# Patient Record
Sex: Female | Born: 1955 | Race: White | Hispanic: No | Marital: Married | Smoking: Never smoker
Health system: Southern US, Community
[De-identification: ages and names within clinical notes are randomized; demographics above are authoritative.]

## PROBLEM LIST (undated history)

## (undated) DIAGNOSIS — N6001 Solitary cyst of right breast: Secondary | ICD-10-CM

## (undated) DIAGNOSIS — E079 Disorder of thyroid, unspecified: Secondary | ICD-10-CM

## (undated) DIAGNOSIS — E785 Hyperlipidemia, unspecified: Secondary | ICD-10-CM

## (undated) DIAGNOSIS — G35 Multiple sclerosis: Secondary | ICD-10-CM

## (undated) DIAGNOSIS — Z79890 Hormone replacement therapy (postmenopausal): Secondary | ICD-10-CM

## (undated) DIAGNOSIS — N6310 Unspecified lump in the right breast, unspecified quadrant: Secondary | ICD-10-CM

## (undated) DIAGNOSIS — E7800 Pure hypercholesterolemia, unspecified: Secondary | ICD-10-CM

## (undated) DIAGNOSIS — G43909 Migraine, unspecified, not intractable, without status migrainosus: Secondary | ICD-10-CM

## (undated) HISTORY — PX: BREAST SURGERY: SHX581

## (undated) HISTORY — DX: Unspecified lump in the right breast, unspecified quadrant: N63.10

## (undated) HISTORY — DX: Hormone replacement therapy: Z79.890

## (undated) HISTORY — DX: Multiple sclerosis: G35

## (undated) HISTORY — DX: Migraine, unspecified, not intractable, without status migrainosus: G43.909

## (undated) HISTORY — DX: Solitary cyst of right breast: N60.01

## (undated) HISTORY — DX: Disorder of thyroid, unspecified: E07.9

## (undated) HISTORY — DX: Hyperlipidemia, unspecified: E78.5

## (undated) HISTORY — DX: Pure hypercholesterolemia, unspecified: E78.00

## (undated) HISTORY — PX: DILATION AND CURETTAGE OF UTERUS: SHX78

---

## 2000-10-20 ENCOUNTER — Other Ambulatory Visit: Admission: RE | Admit: 2000-10-20 | Discharge: 2000-10-20 | Payer: Self-pay | Admitting: Specialist

## 2003-09-16 ENCOUNTER — Ambulatory Visit: Admission: RE | Admit: 2003-09-16 | Discharge: 2003-09-16 | Payer: Self-pay | Admitting: Otolaryngology

## 2005-12-08 ENCOUNTER — Encounter (INDEPENDENT_AMBULATORY_CARE_PROVIDER_SITE_OTHER): Payer: Self-pay | Admitting: *Deleted

## 2005-12-08 ENCOUNTER — Ambulatory Visit (HOSPITAL_COMMUNITY): Admission: RE | Admit: 2005-12-08 | Discharge: 2005-12-08 | Payer: Self-pay | Admitting: General Surgery

## 2008-01-26 ENCOUNTER — Other Ambulatory Visit: Admission: RE | Admit: 2008-01-26 | Discharge: 2008-01-26 | Payer: Self-pay | Admitting: Obstetrics and Gynecology

## 2008-01-31 ENCOUNTER — Ambulatory Visit (HOSPITAL_COMMUNITY): Admission: RE | Admit: 2008-01-31 | Discharge: 2008-01-31 | Payer: Self-pay | Admitting: Obstetrics & Gynecology

## 2008-06-20 ENCOUNTER — Ambulatory Visit (HOSPITAL_COMMUNITY): Admission: RE | Admit: 2008-06-20 | Discharge: 2008-06-20 | Payer: Self-pay | Admitting: Obstetrics & Gynecology

## 2008-12-11 ENCOUNTER — Ambulatory Visit (HOSPITAL_COMMUNITY): Admission: RE | Admit: 2008-12-11 | Discharge: 2008-12-11 | Payer: Self-pay | Admitting: Family Medicine

## 2009-01-17 ENCOUNTER — Ambulatory Visit (HOSPITAL_COMMUNITY): Admission: RE | Admit: 2009-01-17 | Discharge: 2009-01-17 | Payer: Self-pay | Admitting: General Surgery

## 2009-01-31 ENCOUNTER — Other Ambulatory Visit: Admission: RE | Admit: 2009-01-31 | Discharge: 2009-01-31 | Payer: Self-pay | Admitting: Obstetrics and Gynecology

## 2009-04-10 ENCOUNTER — Encounter: Payer: Self-pay | Admitting: Orthopedic Surgery

## 2009-04-10 ENCOUNTER — Ambulatory Visit (HOSPITAL_COMMUNITY): Admission: RE | Admit: 2009-04-10 | Discharge: 2009-04-10 | Payer: Self-pay | Admitting: Family Medicine

## 2009-04-23 ENCOUNTER — Ambulatory Visit: Payer: Self-pay | Admitting: Orthopedic Surgery

## 2009-04-23 DIAGNOSIS — S93409A Sprain of unspecified ligament of unspecified ankle, initial encounter: Secondary | ICD-10-CM | POA: Insufficient documentation

## 2010-05-28 ENCOUNTER — Other Ambulatory Visit
Admission: RE | Admit: 2010-05-28 | Discharge: 2010-05-28 | Payer: Self-pay | Source: Home / Self Care | Admitting: Obstetrics and Gynecology

## 2010-07-30 ENCOUNTER — Other Ambulatory Visit: Payer: Self-pay | Admitting: Dermatology

## 2010-10-07 NOTE — H&P (Signed)
[name], [name]              ACCOUNT NO.:  [id number]   MEDICAL RECORD NO.:  [id number]         PATIENT TYPE:  PAMB   LOCATION:  DAY                           FACILITY:  [facility]   PHYSICIAN:  Dalia Heading, M.D.  DATE OF BIRTH:  01-14-1956   DATE OF ADMISSION:  01/01/2009  DATE OF DISCHARGE:  LH                              HISTORY & PHYSICAL   CHIEF COMPLAINT:  History of colon polyp.   HISTORY OF PRESENT ILLNESS:  The patient is a 55 year old white female  who presents for followup colonoscopy.  She had a colonoscopy with  polypectomy for a rectal polyp in 2007.  A tubular adenoma with no high-  grade dysplasia or malignancy was found at that time.  She now presents  for followup colonoscopy.  She denies any weight loss, nausea, vomiting,  diarrhea, constipation, melena, or hematochezia.  There is no family  history of colon carcinoma.   PAST MEDICAL HISTORY:  High cholesterol levels.   PAST SURGICAL HISTORY:  Unremarkable.   CURRENT MEDICATIONS:  Lipitor, Prempro, Lexapro, Flonase, Ambien, and  Synthroid.   ALLERGIES:  No known drug allergies.   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  No other cardiopulmonary difficulties noted.   PHYSICAL EXAMINATION:  GENERAL:  The patient is a well-developed, well-  nourished white female in no acute distress.  LUNGS:  Clear to auscultation with equal breath sounds bilaterally.  HEART:  Regular rate and rhythm without S3, S4, murmurs.  ABDOMEN:  Soft, nontender, nondistended.  No hepatosplenomegaly or  masses are noted.  RECTAL:  Deferred to the procedure.   IMPRESSION:  Colon polyps.   PLAN:  The patient is scheduled for a colonoscopy on January 17, 2009.  The risks and benefits of the procedure including bleeding and  perforation were fully explained to the patient, gave informed consent.      Dalia Heading, M.D.  Electronically Signed     MAJ/MEDQ  D:  01/01/2009  T:   01/02/2009  Job:  [id number]   cc:   Corrie Mckusick, M.D.  Fax: [phone]   Short-Stay Unit at [facility]

## 2010-10-10 NOTE — H&P (Signed)
NAMEJOANIE, DUPREY              ACCOUNT NO.:  [id number]   MEDICAL RECORD NO.:  [id number]           PATIENT TYPE:  [facility]   LOCATION:                                FACILITY[facility]   PHYSICIAN:  Dalia Heading, M.D.  DATE OF BIRTH:  May 31, 1955   DATE OF ADMISSION:  DATE OF DISCHARGE:  LH                                HISTORY & PHYSICAL   CHIEF COMPLAINT:  Need for screening colonoscopy.   HISTORY OF PRESENT ILLNESS:  The patient is a 55 year old white female who  is referred for endoscopic evaluation.  She needs a colonoscopy for  screening purposes.  No abdominal pain, weight loss, nausea, vomiting,  diarrhea, constipation, melena, hematochezia has been noted.  She has never  had a colonoscopy.  There is no family history of colon carcinoma.   PAST MEDICAL HISTORY:  Past medical history is unremarkable.   PAST SURGICAL HISTORY:  Unremarkable.   CURRENT MEDICATIONS:  Extrinsic allergy medications, Prempro.   ALLERGIES:  No known drug allergies.   REVIEW OF SYSTEMS:  Noncontributory.   PHYSICAL EXAMINATION:  GENERAL:  The patient is a well-developed, well-  nourished white female in no acute distress.  LUNGS:  Clear to auscultation with equal breath sounds bilaterally.  HEART:  Heart examination reveals regular rate and rhythm without S3, S4,  murmurs.  ABDOMEN:  The abdomen is soft, nontender, nondistended.  No  hepatosplenomegaly or masses are noted.  RECTAL:  Examination was deferred to the procedure.   IMPRESSION:  Need for screening colonoscopy.   PLAN:  The patient is scheduled for colonoscopy on December 08, 2005.  Risks and  benefits of the procedure including bleeding and perforation were fully  explained to the patient, who gave informed consent.      Dalia Heading, M.D.  Electronically Signed     MAJ/MEDQ  D:  12/01/2005  T:  12/01/2005  Job:  [id number]   cc:   Kirk Ruths, M.D.  Fax: [phone]

## 2011-06-17 ENCOUNTER — Other Ambulatory Visit: Payer: Self-pay | Admitting: Adult Health

## 2011-06-17 ENCOUNTER — Other Ambulatory Visit (HOSPITAL_COMMUNITY)
Admission: RE | Admit: 2011-06-17 | Discharge: 2011-06-17 | Disposition: A | Discharge status: Home / Self Care | Payer: 59 | Source: Ambulatory Visit | Attending: Obstetrics and Gynecology | Admitting: Obstetrics and Gynecology

## 2011-06-17 DIAGNOSIS — Z01419 Encounter for gynecological examination (general) (routine) without abnormal findings: Secondary | ICD-10-CM | POA: Insufficient documentation

## 2012-06-27 ENCOUNTER — Other Ambulatory Visit: Payer: Self-pay | Admitting: Adult Health

## 2012-06-27 ENCOUNTER — Other Ambulatory Visit (HOSPITAL_COMMUNITY)
Admission: RE | Admit: 2012-06-27 | Discharge: 2012-06-27 | Disposition: A | Discharge status: Home / Self Care | Payer: 59 | Source: Ambulatory Visit | Attending: Obstetrics and Gynecology | Admitting: Obstetrics and Gynecology

## 2012-06-27 DIAGNOSIS — E049 Nontoxic goiter, unspecified: Secondary | ICD-10-CM

## 2012-06-27 DIAGNOSIS — Z1151 Encounter for screening for human papillomavirus (HPV): Secondary | ICD-10-CM | POA: Insufficient documentation

## 2012-06-27 DIAGNOSIS — Z01419 Encounter for gynecological examination (general) (routine) without abnormal findings: Secondary | ICD-10-CM | POA: Insufficient documentation

## 2012-07-05 ENCOUNTER — Ambulatory Visit (HOSPITAL_COMMUNITY): Payer: 59

## 2012-07-12 ENCOUNTER — Ambulatory Visit (HOSPITAL_COMMUNITY): Payer: 59

## 2012-07-14 ENCOUNTER — Other Ambulatory Visit (HOSPITAL_COMMUNITY): Payer: Self-pay | Admitting: Adult Health

## 2012-07-14 DIAGNOSIS — E049 Nontoxic goiter, unspecified: Secondary | ICD-10-CM

## 2012-07-18 ENCOUNTER — Ambulatory Visit (HOSPITAL_COMMUNITY)
Admission: RE | Admit: 2012-07-18 | Discharge: 2012-07-18 | Disposition: A | Discharge status: Home / Self Care | Payer: 59 | Source: Ambulatory Visit | Attending: Adult Health | Admitting: Adult Health

## 2012-07-18 DIAGNOSIS — E049 Nontoxic goiter, unspecified: Secondary | ICD-10-CM

## 2013-05-30 ENCOUNTER — Encounter: Payer: Self-pay | Admitting: Adult Health

## 2013-06-28 ENCOUNTER — Other Ambulatory Visit: Payer: Self-pay | Admitting: Adult Health

## 2013-07-07 ENCOUNTER — Encounter: Payer: Self-pay | Admitting: Adult Health

## 2013-07-07 ENCOUNTER — Encounter (INDEPENDENT_AMBULATORY_CARE_PROVIDER_SITE_OTHER): Payer: Self-pay

## 2013-07-07 ENCOUNTER — Ambulatory Visit (INDEPENDENT_AMBULATORY_CARE_PROVIDER_SITE_OTHER): Payer: 59 | Admitting: Adult Health

## 2013-07-07 VITALS — BP 140/80 | HR 70 | Ht 63.0 in | Wt 153.0 lb

## 2013-07-07 DIAGNOSIS — G35D Multiple sclerosis, unspecified: Secondary | ICD-10-CM | POA: Insufficient documentation

## 2013-07-07 DIAGNOSIS — Z1212 Encounter for screening for malignant neoplasm of rectum: Secondary | ICD-10-CM

## 2013-07-07 DIAGNOSIS — G35 Multiple sclerosis: Secondary | ICD-10-CM | POA: Insufficient documentation

## 2013-07-07 DIAGNOSIS — Z01419 Encounter for gynecological examination (general) (routine) without abnormal findings: Secondary | ICD-10-CM

## 2013-07-07 LAB — HEMOCCULT GUIAC POC 1CARD (OFFICE): FECAL OCCULT BLD: NEGATIVE

## 2013-07-07 NOTE — Patient Instructions (Signed)
Physical in 1 year Mammogram yearly Colonoscopy as per GI  Labs with PCP

## 2013-07-07 NOTE — Progress Notes (Signed)
Patient ID: [name], female   DOB: Oct 13, 1955, 58 y.o.   MRN: [id number] History of Present Illness: [name] is a 58 year old white female, married in for physical.Had normal pap with negative HPV 06/27/12.Has no complaints, has seen Dr Darlyn Chamber in [place] and had labs recently.   Current Medications, Allergies, Past Medical History, Past Surgical History, Family History and Social History were reviewed in [employer] record.     Review of Systems: Patient denies any headaches, blurred vision, shortness of breath, chest pain, abdominal pain, problems with bowel movements, urination, or intercourse.  No joint pain or mood swings.   Physical Exam:BP 140/80  Pulse 70  Ht 5\' 3"  (1.6 m)  Wt 153 lb (69.4 kg)  BMI 27.11 kg/m2 General:  Well developed, well nourished, no acute distress Skin:  Warm and dry Neck:  Midline trachea, normal thyroid Lungs; Clear to auscultation bilaterally Breast:  No dominant palpable mass, retraction, or nipple discharge, hs bilateral implants. Cardiovascular: Regular rate and rhythm Abdomen:  Soft, non tender, no hepatosplenomegaly Pelvic:  External genitalia is normal in appearance.  The vagina is normal in appearance for age.               The cervix is atrophic and smooth..  Uterus is felt to be normal size, shape, and contour.  No                adnexal masses or tenderness noted. Rectal: Good sphincter tone, no polyps, or hemorrhoids felt.  Hemoccult negative. Extremities:  No swelling or varicosities noted Psych:  No mood changes, alert and cooperative,seems happy, going to have a second grandchild in August.   Impression: Yearly gyn exam no pap MS    Plan: Physical in 1 year Mammogram yearly Colonoscopy as per GI Labs with PCP

## 2014-03-26 ENCOUNTER — Encounter: Payer: Self-pay | Admitting: Adult Health

## 2014-05-16 ENCOUNTER — Other Ambulatory Visit (HOSPITAL_COMMUNITY): Payer: Self-pay | Admitting: Family Medicine

## 2014-05-16 ENCOUNTER — Ambulatory Visit (HOSPITAL_COMMUNITY)
Admission: RE | Admit: 2014-05-16 | Discharge: 2014-05-16 | Disposition: A | Discharge status: Home / Self Care | Payer: 59 | Source: Ambulatory Visit | Attending: Family Medicine | Admitting: Family Medicine

## 2014-05-16 DIAGNOSIS — M545 Low back pain: Secondary | ICD-10-CM | POA: Diagnosis not present

## 2014-05-16 DIAGNOSIS — M4856XA Collapsed vertebra, not elsewhere classified, lumbar region, initial encounter for fracture: Secondary | ICD-10-CM | POA: Diagnosis not present

## 2014-05-16 DIAGNOSIS — S339XXD Sprain of unspecified parts of lumbar spine and pelvis, subsequent encounter: Secondary | ICD-10-CM | POA: Insufficient documentation

## 2014-05-16 DIAGNOSIS — W19XXXD Unspecified fall, subsequent encounter: Secondary | ICD-10-CM | POA: Diagnosis not present

## 2014-05-16 DIAGNOSIS — S335XXD Sprain of ligaments of lumbar spine, subsequent encounter: Secondary | ICD-10-CM

## 2014-07-09 ENCOUNTER — Other Ambulatory Visit: Payer: 59 | Admitting: Adult Health

## 2014-07-10 ENCOUNTER — Encounter: Payer: Self-pay | Admitting: Adult Health

## 2014-08-02 ENCOUNTER — Ambulatory Visit (INDEPENDENT_AMBULATORY_CARE_PROVIDER_SITE_OTHER): Payer: 59 | Admitting: Adult Health

## 2014-08-02 ENCOUNTER — Encounter: Payer: Self-pay | Admitting: Adult Health

## 2014-08-02 VITALS — BP 134/78 | HR 68 | Ht 62.5 in | Wt 124.0 lb

## 2014-08-02 DIAGNOSIS — Z1212 Encounter for screening for malignant neoplasm of rectum: Secondary | ICD-10-CM | POA: Diagnosis not present

## 2014-08-02 DIAGNOSIS — Z01419 Encounter for gynecological examination (general) (routine) without abnormal findings: Secondary | ICD-10-CM

## 2014-08-02 DIAGNOSIS — Z79890 Hormone replacement therapy (postmenopausal): Secondary | ICD-10-CM

## 2014-08-02 HISTORY — DX: Hormone replacement therapy: Z79.890

## 2014-08-02 LAB — HEMOCCULT GUIAC POC 1CARD (OFFICE): FECAL OCCULT BLD: NEGATIVE

## 2014-08-02 NOTE — Patient Instructions (Signed)
Pap and physical in 1 year Mammogram yearly Labs with PCP 

## 2014-08-02 NOTE — Progress Notes (Signed)
Patient ID: [name], female   DOB: Mar 02, 1956, 59 y.o.   MRN: [id number] History of Present Illness: [name] is a 59 year old white female, married in for well woman gyn exam.She had normal pap with negative HPV 06/27/12.   Current Medications, Allergies, Past Medical History, Past Surgical History, Family History and Social History were reviewed in [employer] record.     Review of Systems: Patient denies any headaches, hearing loss, fatigue, blurred vision, shortness of breath, chest pain, abdominal pain, problems with bowel movements, urination, or intercourse. No joint pain or mood swings.Has no complaints and loves her HRT.She had labs with PCP(Dr Hilma Favors)    Physical Exam:BP 134/78 mmHg  Pulse 68  Ht 5' 2.5" (1.588 m)  Wt 124 lb (56.246 kg)  BMI 22.30 kg/m2 General:  Well developed, well nourished, no acute distress Skin:  Warm and dry Neck:  Midline trachea, normal thyroid, good ROM, no lymphadenopathy, no carotid bruits Lungs; Clear to auscultation bilaterally Breast:  No dominant palpable mass, retraction, or nipple discharge, bilateral implants Cardiovascular: Regular rate and rhythm Abdomen:  Soft, non tender, no hepatosplenomegaly Pelvic:  External genitalia is normal in appearance, no lesions.  The vagina has decreased color, moisture and rugae. Urethra has no lesions or masses. The cervix is smooth.  Uterus is felt to be normal size, shape, and contour.  No adnexal masses or tenderness noted.Bladder is non tender, no masses felt. Rectal: Good sphincter tone, no polyps, or hemorrhoids felt.  Hemoccult negative. Extremities/musculoskeletal:  No swelling or varicosities noted, no clubbing or cyanosis Psych:  No mood changes, alert and cooperative,seems happy   Impression: Well woman gyn exam no pap HRT MS    Plan: Call if needs refills on estrace and promethium Pap and physical in 1 year Mammogram yearly  Labs with PCP Colonoscopy per  GI

## 2014-12-17 ENCOUNTER — Other Ambulatory Visit: Payer: Self-pay | Admitting: Adult Health

## 2014-12-17 MED ORDER — ESTRADIOL 0.5 MG PO TABS: 0.5000 mg | ORAL_TABLET | Freq: Every day | ORAL | Status: DC

## 2014-12-18 ENCOUNTER — Other Ambulatory Visit (HOSPITAL_COMMUNITY): Payer: Self-pay | Admitting: Family Medicine

## 2014-12-18 ENCOUNTER — Other Ambulatory Visit: Payer: Self-pay | Admitting: Adult Health

## 2014-12-18 DIAGNOSIS — E039 Hypothyroidism, unspecified: Secondary | ICD-10-CM

## 2014-12-18 DIAGNOSIS — R599 Enlarged lymph nodes, unspecified: Secondary | ICD-10-CM

## 2014-12-18 MED ORDER — PROGESTERONE MICRONIZED 100 MG PO CAPS: 100.0000 mg | ORAL_CAPSULE | Freq: Every day | ORAL | Status: DC

## 2014-12-18 MED ORDER — TESTOSTERONE 10 MG/ACT (2%) TD GEL: TRANSDERMAL | Status: DC

## 2014-12-20 ENCOUNTER — Ambulatory Visit (HOSPITAL_COMMUNITY): Payer: 59

## 2015-01-10 ENCOUNTER — Ambulatory Visit (HOSPITAL_COMMUNITY): Payer: 59

## 2015-01-24 ENCOUNTER — Other Ambulatory Visit: Payer: Self-pay | Admitting: *Deleted

## 2015-06-29 ENCOUNTER — Other Ambulatory Visit: Payer: Self-pay | Admitting: Adult Health

## 2015-07-24 ENCOUNTER — Encounter: Payer: Self-pay | Admitting: Adult Health

## 2015-07-24 DIAGNOSIS — N6001 Solitary cyst of right breast: Secondary | ICD-10-CM | POA: Insufficient documentation

## 2015-07-24 HISTORY — DX: Solitary cyst of right breast: N60.01

## 2015-07-29 ENCOUNTER — Encounter: Payer: Self-pay | Admitting: Adult Health

## 2015-08-06 ENCOUNTER — Other Ambulatory Visit (HOSPITAL_COMMUNITY)
Admission: RE | Admit: 2015-08-06 | Discharge: 2015-08-06 | Disposition: A | Discharge status: Home / Self Care | Payer: 59 | Source: Ambulatory Visit | Attending: Adult Health | Admitting: Adult Health

## 2015-08-06 ENCOUNTER — Encounter: Payer: Self-pay | Admitting: Adult Health

## 2015-08-06 ENCOUNTER — Ambulatory Visit (INDEPENDENT_AMBULATORY_CARE_PROVIDER_SITE_OTHER): Payer: 59 | Admitting: Adult Health

## 2015-08-06 VITALS — BP 130/70 | HR 72 | Ht 63.0 in | Wt 128.0 lb

## 2015-08-06 DIAGNOSIS — Z01419 Encounter for gynecological examination (general) (routine) without abnormal findings: Secondary | ICD-10-CM

## 2015-08-06 DIAGNOSIS — Z1151 Encounter for screening for human papillomavirus (HPV): Secondary | ICD-10-CM | POA: Insufficient documentation

## 2015-08-06 DIAGNOSIS — Z79890 Hormone replacement therapy (postmenopausal): Secondary | ICD-10-CM

## 2015-08-06 DIAGNOSIS — Z1212 Encounter for screening for malignant neoplasm of rectum: Secondary | ICD-10-CM | POA: Diagnosis not present

## 2015-08-06 LAB — HEMOCCULT GUIAC POC 1CARD (OFFICE): Fecal Occult Blood, POC: NEGATIVE

## 2015-08-06 NOTE — Progress Notes (Signed)
Patient ID: [name], female   DOB: [birth year], 60 y.o.   MRN: [id number] History of Present Illness: [name] is a 60 year old white female, married, in for a well woman gyn exam and pap. 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 PCP is Dr Hilma Favors.   Current Medications, Allergies, Past Medical History, Past Surgical History, Family History and Social History were reviewed in [employer] record.     Review of Systems: Patient denies any daily headaches, hearing loss, fatigue, blurred vision, shortness of breath, chest pain, abdominal pain, problems with bowel movements, urination, or intercourse. No joint pain or mood swings.See HPI for positives.    Physical Exam:BP 130/70 mmHg  Pulse 72  Ht 5\' 3"  (1.6 m)  Wt 128 lb (58.06 kg)  BMI 22.68 kg/m2 General:  Well developed, well nourished, no acute distress Skin:  Warm and dry Neck:  Midline trachea, normal thyroid, good ROM, no lymphadenopathy, no carotid bruits heard Lungs; Clear to auscultation bilaterally Breast:  No dominant palpable mass, retraction, or nipple discharge, has bilateral implants Cardiovascular: Regular rate and rhythm Abdomen:  Soft, non tender, no hepatosplenomegaly Pelvic:  External genitalia is normal in appearance, no lesions.  The vagina has decreased color, moisture and rugae. Urethra has no lesions or masses. The cervix is smooth, pap with HPV performed.  Uterus is felt to be normal size, shape, and contour.  No adnexal masses or tenderness noted.Bladder is non tender, no masses felt. Rectal: Good sphincter tone, no polyps, or hemorrhoids felt.  Hemoccult negative. Extremities/musculoskeletal:  No swelling or varicosities noted, no clubbing or cyanosis Psych:  No mood changes, alert and cooperative,seems  happy   Impression: Well woman gyn exam and pap HRT    Plan: Physical in 1 year, pap in 3 if normal Mammogram in 6 months then yearly if normal Labs with PCP Colonoscopy per Dr Arnoldo Morale  Continue HRT has refills

## 2015-08-06 NOTE — Patient Instructions (Signed)
Physical in 1 year Pap in 3 if normal Labs with PCP Mammogram in 6 months, then yearly Colonoscopy per Dr Arnoldo Morale

## 2015-08-07 LAB — CYTOLOGY - PAP

## 2015-09-16 ENCOUNTER — Other Ambulatory Visit: Payer: Self-pay | Admitting: Adult Health

## 2015-09-30 ENCOUNTER — Telehealth: Payer: Self-pay | Admitting: Adult Health

## 2015-09-30 NOTE — Telephone Encounter (Signed)
She has found new spot on breast, will make appt for Thursday at her request, be here at 2:30 to be examined

## 2015-10-03 ENCOUNTER — Encounter: Payer: Self-pay | Admitting: Adult Health

## 2015-10-03 ENCOUNTER — Ambulatory Visit (INDEPENDENT_AMBULATORY_CARE_PROVIDER_SITE_OTHER): Payer: 59 | Admitting: Adult Health

## 2015-10-03 VITALS — BP 112/62 | HR 84 | Ht 63.0 in | Wt 128.0 lb

## 2015-10-03 DIAGNOSIS — N63 Unspecified lump in breast: Secondary | ICD-10-CM | POA: Diagnosis not present

## 2015-10-03 DIAGNOSIS — N6310 Unspecified lump in the right breast, unspecified quadrant: Secondary | ICD-10-CM

## 2015-10-03 DIAGNOSIS — Z9882 Breast implant status: Secondary | ICD-10-CM | POA: Diagnosis not present

## 2015-10-03 HISTORY — DX: Unspecified lump in the right breast, unspecified quadrant: N63.10

## 2015-10-03 NOTE — Patient Instructions (Addendum)
Get right mammogram and [place] at 5/16 at 7:45 am at [facility] Follow up prn

## 2015-10-03 NOTE — Progress Notes (Signed)
Subjective:     Patient ID: [name], female   DOB: 07-13-1955, 60 y.o.   MRN: [id number]  HPI [name] is a 60 year old white female, married in for breast exam,she has found a lump in right breast, she has implants, and gets her mammograms at [facility].   Review of Systems +breast mass on right Reviewed past medical,surgical, social and family history. Reviewed medications and allergies.     Objective:   Physical Exam BP 112/62 mmHg  Pulse 84  Ht 5\' 3"  (1.6 m)  Wt 128 lb (58.06 kg)  BMI 22.68 kg/m2   Skin warm and dry,  Breasts:no dominate palpable mass, retraction or nipple discharge on left, no retraction or nipple discharge on the right but has BB sized mass at 3 0'clock on edge of areola, non tender and mobile, she has bilateral implants    Assessment:     Right breast mass    Plan:     Right diagnostic mammogram and [place] at [facility] 5/16 at 7:45 am Follow up prn

## 2015-10-18 ENCOUNTER — Encounter: Payer: Self-pay | Admitting: Adult Health

## 2015-11-15 ENCOUNTER — Other Ambulatory Visit: Payer: Self-pay | Admitting: Family Medicine

## 2015-11-15 ENCOUNTER — Other Ambulatory Visit: Payer: Self-pay | Admitting: Adult Health

## 2015-11-15 DIAGNOSIS — R5381 Other malaise: Secondary | ICD-10-CM

## 2015-11-18 ENCOUNTER — Other Ambulatory Visit: Payer: Self-pay | Admitting: Family Medicine

## 2015-11-18 DIAGNOSIS — E2839 Other primary ovarian failure: Secondary | ICD-10-CM

## 2015-12-10 ENCOUNTER — Inpatient Hospital Stay: Admission: RE | Admit: 2015-12-10 | Payer: 59 | Source: Ambulatory Visit

## 2015-12-11 ENCOUNTER — Other Ambulatory Visit: Payer: Self-pay | Admitting: Adult Health

## 2015-12-23 ENCOUNTER — Ambulatory Visit
Admission: RE | Admit: 2015-12-23 | Discharge: 2015-12-23 | Disposition: A | Discharge status: Home / Self Care | Payer: 59 | Source: Ambulatory Visit | Attending: Family Medicine | Admitting: Family Medicine

## 2015-12-23 DIAGNOSIS — E2839 Other primary ovarian failure: Secondary | ICD-10-CM

## 2016-01-07 ENCOUNTER — Encounter: Payer: Self-pay | Admitting: Adult Health

## 2016-03-19 ENCOUNTER — Other Ambulatory Visit: Payer: Self-pay | Admitting: Adult Health

## 2016-07-27 ENCOUNTER — Other Ambulatory Visit: Payer: Self-pay | Admitting: Adult Health

## 2016-08-06 ENCOUNTER — Ambulatory Visit (INDEPENDENT_AMBULATORY_CARE_PROVIDER_SITE_OTHER): Payer: 59 | Admitting: Adult Health

## 2016-08-06 ENCOUNTER — Encounter: Payer: Self-pay | Admitting: Adult Health

## 2016-08-06 VITALS — BP 130/78 | HR 80 | Ht 62.25 in | Wt 138.0 lb

## 2016-08-06 DIAGNOSIS — Z79890 Hormone replacement therapy (postmenopausal): Secondary | ICD-10-CM

## 2016-08-06 DIAGNOSIS — Z1212 Encounter for screening for malignant neoplasm of rectum: Secondary | ICD-10-CM

## 2016-08-06 DIAGNOSIS — Z131 Encounter for screening for diabetes mellitus: Secondary | ICD-10-CM

## 2016-08-06 DIAGNOSIS — R5383 Other fatigue: Secondary | ICD-10-CM

## 2016-08-06 DIAGNOSIS — Z1211 Encounter for screening for malignant neoplasm of colon: Secondary | ICD-10-CM

## 2016-08-06 DIAGNOSIS — Z01419 Encounter for gynecological examination (general) (routine) without abnormal findings: Secondary | ICD-10-CM

## 2016-08-06 DIAGNOSIS — E039 Hypothyroidism, unspecified: Secondary | ICD-10-CM

## 2016-08-06 LAB — HEMOCCULT GUIAC POC 1CARD (OFFICE): FECAL OCCULT BLD: NEGATIVE

## 2016-08-06 NOTE — Progress Notes (Signed)
Patient ID: [name], female   DOB: Jul 23, 1955, 61 y.o.   MRN: [id number] History of Present Illness:  [name] is a 61 year old white female, married in for a well woman gyn exam, she had a normal pap with negative HPV 08/06/15. PCP is Dr Hilma Favors.  Current Medications, Allergies, Past Medical History, Past Surgical History, Family History and Social History were reviewed in [employer] record.     Review of Systems: Patient denies any headaches, hearing loss, blurred vision, shortness of breath, chest pain, abdominal pain, problems with bowel movements, urination, or intercourse. No joint pain or mood swings. +fatigue   Physical Exam: General:  Well developed, well nourished, no acute distress Skin:  Warm and dry Neck:  Midline trachea, normal thyroid, good ROM, no lymphadenopathy Lungs; Clear to auscultation bilaterally Breast:  No dominant palpable mass, retraction, or nipple discharge,has bilateral implants and some bilateral irregularities.  Cardiovascular: Regular rate and rhythm Abdomen:  Soft, non tender, no hepatosplenomegaly Pelvic:  External genitalia is normal in appearance, no lesions.  The vagina has decreased color, moisture and rugae. Urethra has no lesions or masses. The cervix is atrophic.  Uterus is felt to be normal size, shape, and contour.  No adnexal masses or tenderness noted.Bladder is non tender, no masses felt. Rectal: Good sphincter tone, no polyps, or hemorrhoids felt.  Hemoccult negative. Extremities/musculoskeletal:  No swelling or varicosities noted, no clubbing or cyanosis Psych:  No mood changes, alert and cooperative,seems happy PHQ 2 score 0.  Impression:  1. Well woman exam with routine gynecological exam   2. Screening for colorectal cancer   3. Hormone replacement therapy (HRT)   4. Screening for diabetes mellitus   5. Fatigue, unspecified type   6. Hypothyroidism, unspecified type      Plan: Check CBC,CMP,TSH and  lipids.free T4, A1c and vitamin D Physical in 1 year Pap in 2020 Mammogram yearly Colonoscopy per GI F/U with Dr Hilma Favors next week

## 2016-08-07 ENCOUNTER — Telehealth: Payer: Self-pay | Admitting: Adult Health

## 2016-08-07 LAB — CBC
HEMOGLOBIN: 13.4 g/dL (ref 11.1–15.9)
Hematocrit: 40.7 % (ref 34.0–46.6)
MCH: 29.3 pg (ref 26.6–33.0)
MCHC: 32.9 g/dL (ref 31.5–35.7)
MCV: 89 fL (ref 79–97)
PLATELETS: 304 10*3/uL (ref 150–379)
RBC: 4.57 x10E6/uL (ref 3.77–5.28)
RDW: 13.1 % (ref 12.3–15.4)
WBC: 5.6 10*3/uL (ref 3.4–10.8)

## 2016-08-07 LAB — COMPREHENSIVE METABOLIC PANEL
ALK PHOS: 64 IU/L (ref 39–117)
ALT: 17 IU/L (ref 0–32)
AST: 16 IU/L (ref 0–40)
Albumin/Globulin Ratio: 1.7 (ref 1.2–2.2)
Albumin: 4.7 g/dL (ref 3.6–4.8)
BUN/Creatinine Ratio: 18 (ref 12–28)
BUN: 15 mg/dL (ref 8–27)
Bilirubin Total: 0.3 mg/dL (ref 0.0–1.2)
CALCIUM: 9.6 mg/dL (ref 8.7–10.3)
CO2: 25 mmol/L (ref 18–29)
CREATININE: 0.85 mg/dL (ref 0.57–1.00)
Chloride: 98 mmol/L (ref 96–106)
GFR calc Af Amer: 84 mL/min/{1.73_m2} (ref >59)
GFR calc non Af Amer: 73 mL/min/{1.73_m2} (ref >59)
GLOBULIN, TOTAL: 2.8 g/dL (ref 1.5–4.5)
GLUCOSE: 90 mg/dL (ref 65–99)
Potassium: 4.5 mmol/L (ref 3.5–5.2)
SODIUM: 138 mmol/L (ref 134–144)
TOTAL PROTEIN: 7.5 g/dL (ref 6.0–8.5)

## 2016-08-07 LAB — VITAMIN D 25 HYDROXY (VIT D DEFICIENCY, FRACTURES): Vit D, 25-Hydroxy: 34.5 ng/mL (ref 30.0–100.0)

## 2016-08-07 LAB — LIPID PANEL
CHOLESTEROL TOTAL: 283 mg/dL — AB (ref 100–199)
Chol/HDL Ratio: 4.2 ratio units (ref 0.0–4.4)
HDL: 67 mg/dL (ref >39)
LDL Calculated: 180 mg/dL — ABNORMAL HIGH (ref 0–99)
Triglycerides: 180 mg/dL — ABNORMAL HIGH (ref 0–149)
VLDL CHOLESTEROL CAL: 36 mg/dL (ref 5–40)

## 2016-08-07 LAB — HEMOGLOBIN A1C
ESTIMATED AVERAGE GLUCOSE: 105 mg/dL
HEMOGLOBIN A1C: 5.3 % (ref 4.8–5.6)

## 2016-08-07 LAB — T4, FREE: Free T4: 1.55 ng/dL (ref 0.82–1.77)

## 2016-08-07 LAB — TSH: TSH: 3.34 u[IU]/mL (ref 0.450–4.500)

## 2016-08-07 NOTE — Telephone Encounter (Signed)
Pt aware of labs, has appt with Dr Hilma Favors next week, to review then

## 2016-08-11 ENCOUNTER — Telehealth: Payer: Self-pay | Admitting: Adult Health

## 2016-08-11 MED ORDER — NYSTATIN-TRIAMCINOLONE 100000-0.1 UNIT/GM-% EX CREA: 1.0000 "application " | TOPICAL_CREAM | Freq: Two times a day (BID) | CUTANEOUS | 0 refills | Status: DC

## 2016-08-11 NOTE — Telephone Encounter (Signed)
Pt seen you the other week and you guys had talked about a cream and she has decided to get that can you call into [facility]

## 2016-08-11 NOTE — Telephone Encounter (Signed)
Some external vaginal itching at times, will rx mytrex

## 2016-10-28 IMAGING — CR DG LUMBAR SPINE 2-3V
3 series · 3 of 3 positions shown · non-contrast
Comparison: None.

CLINICAL DATA: Fall.

EXAM:
LUMBAR SPINE - 2-3 VIEW

[view not recorded (1 of 3)]
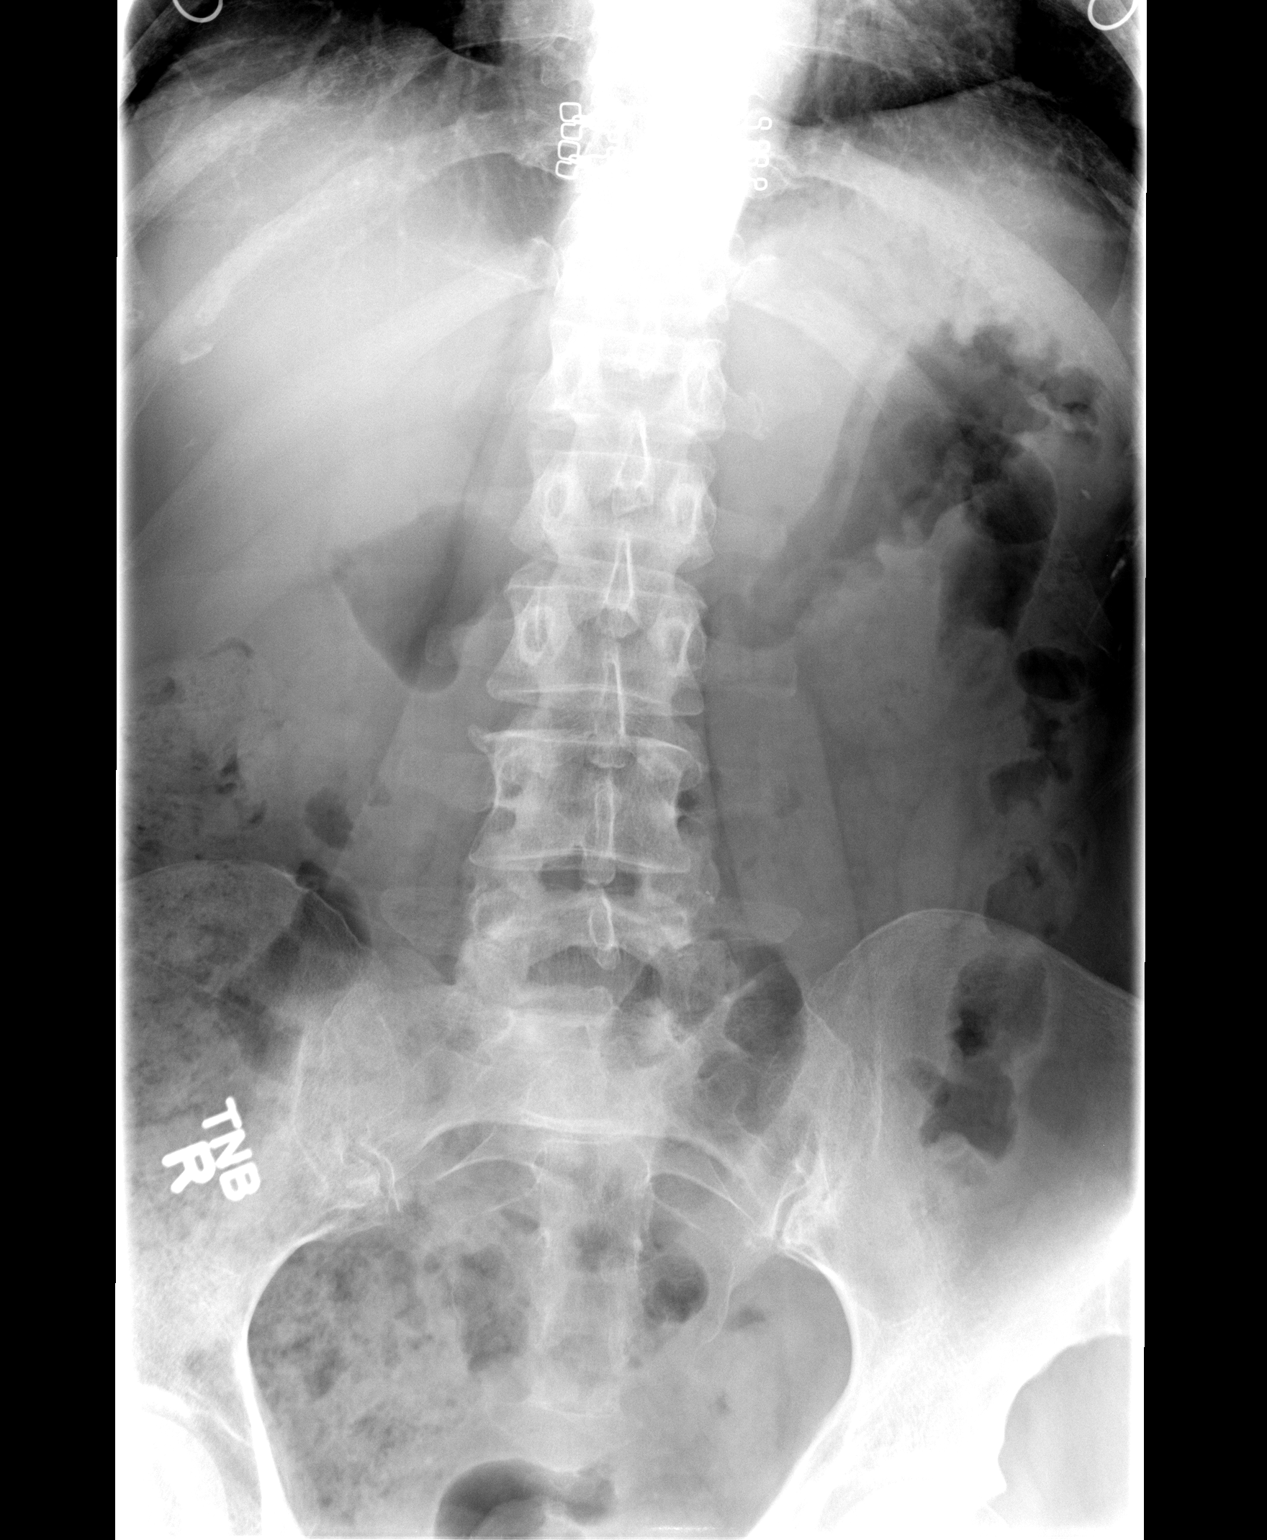

[view not recorded (2 of 3)]
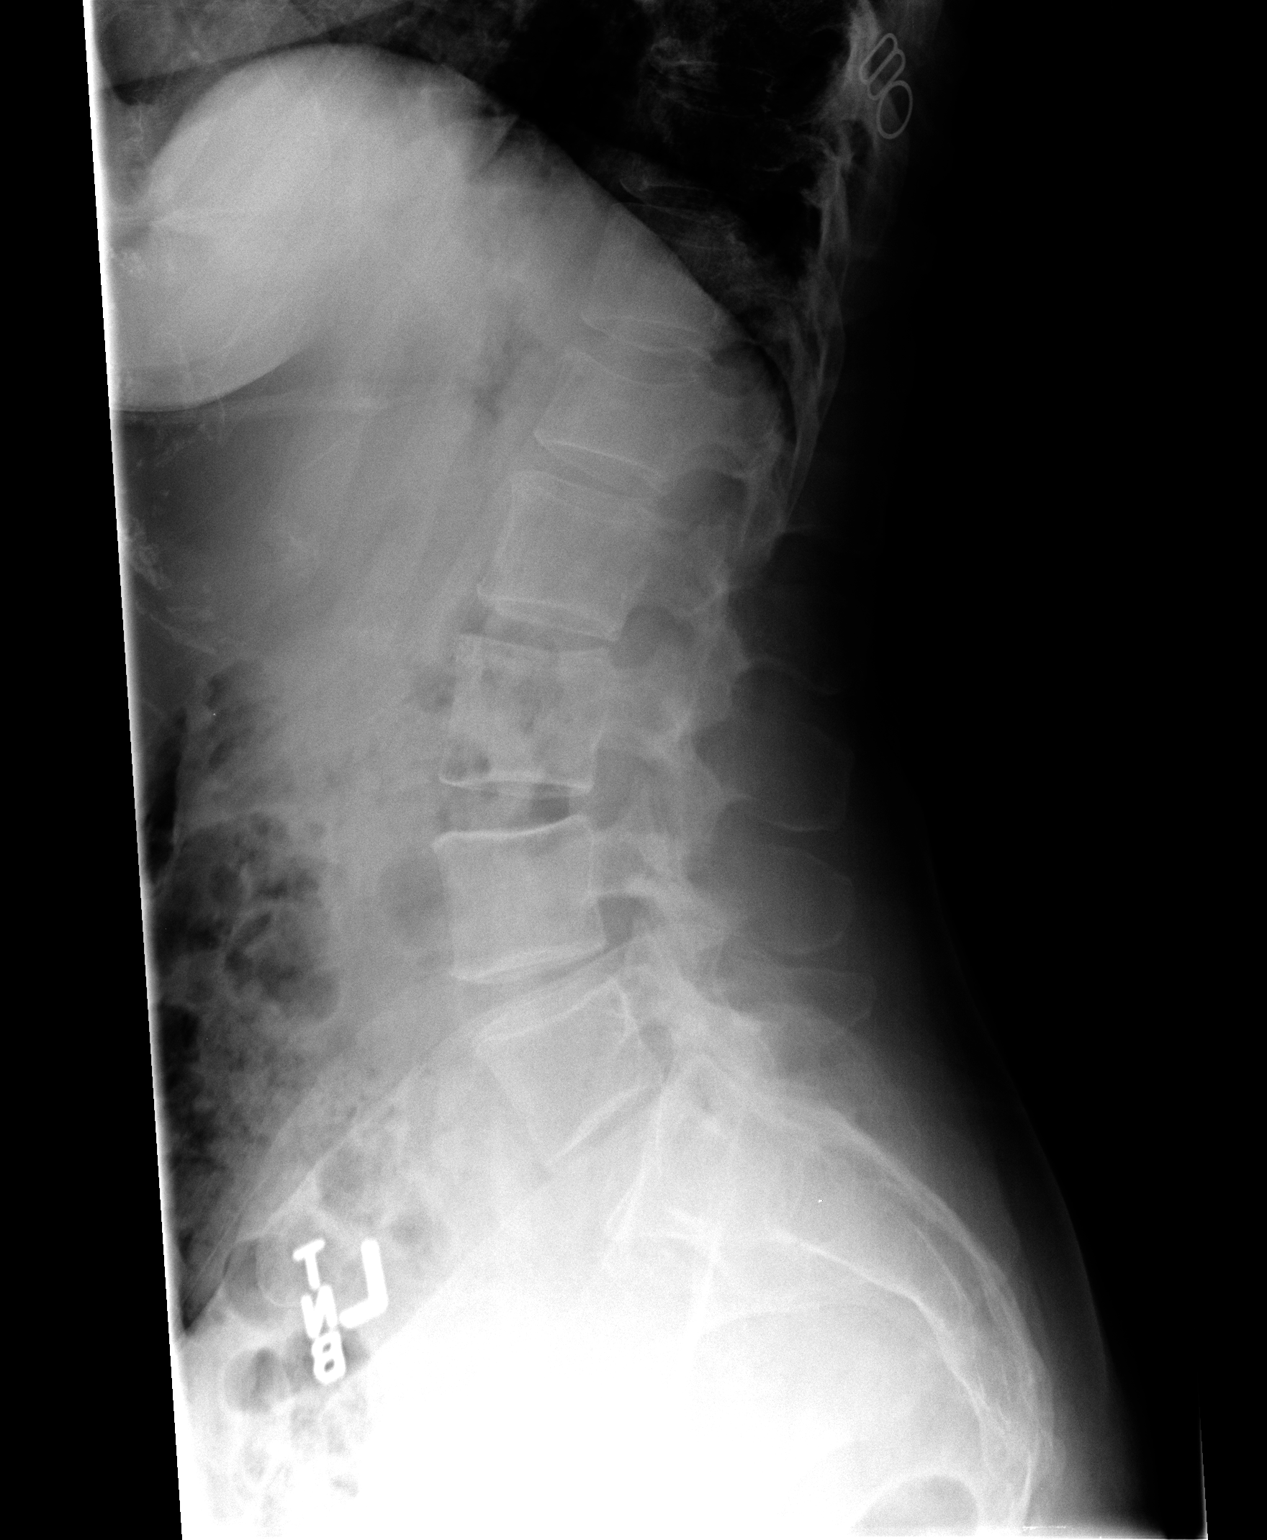

[view not recorded (3 of 3)]
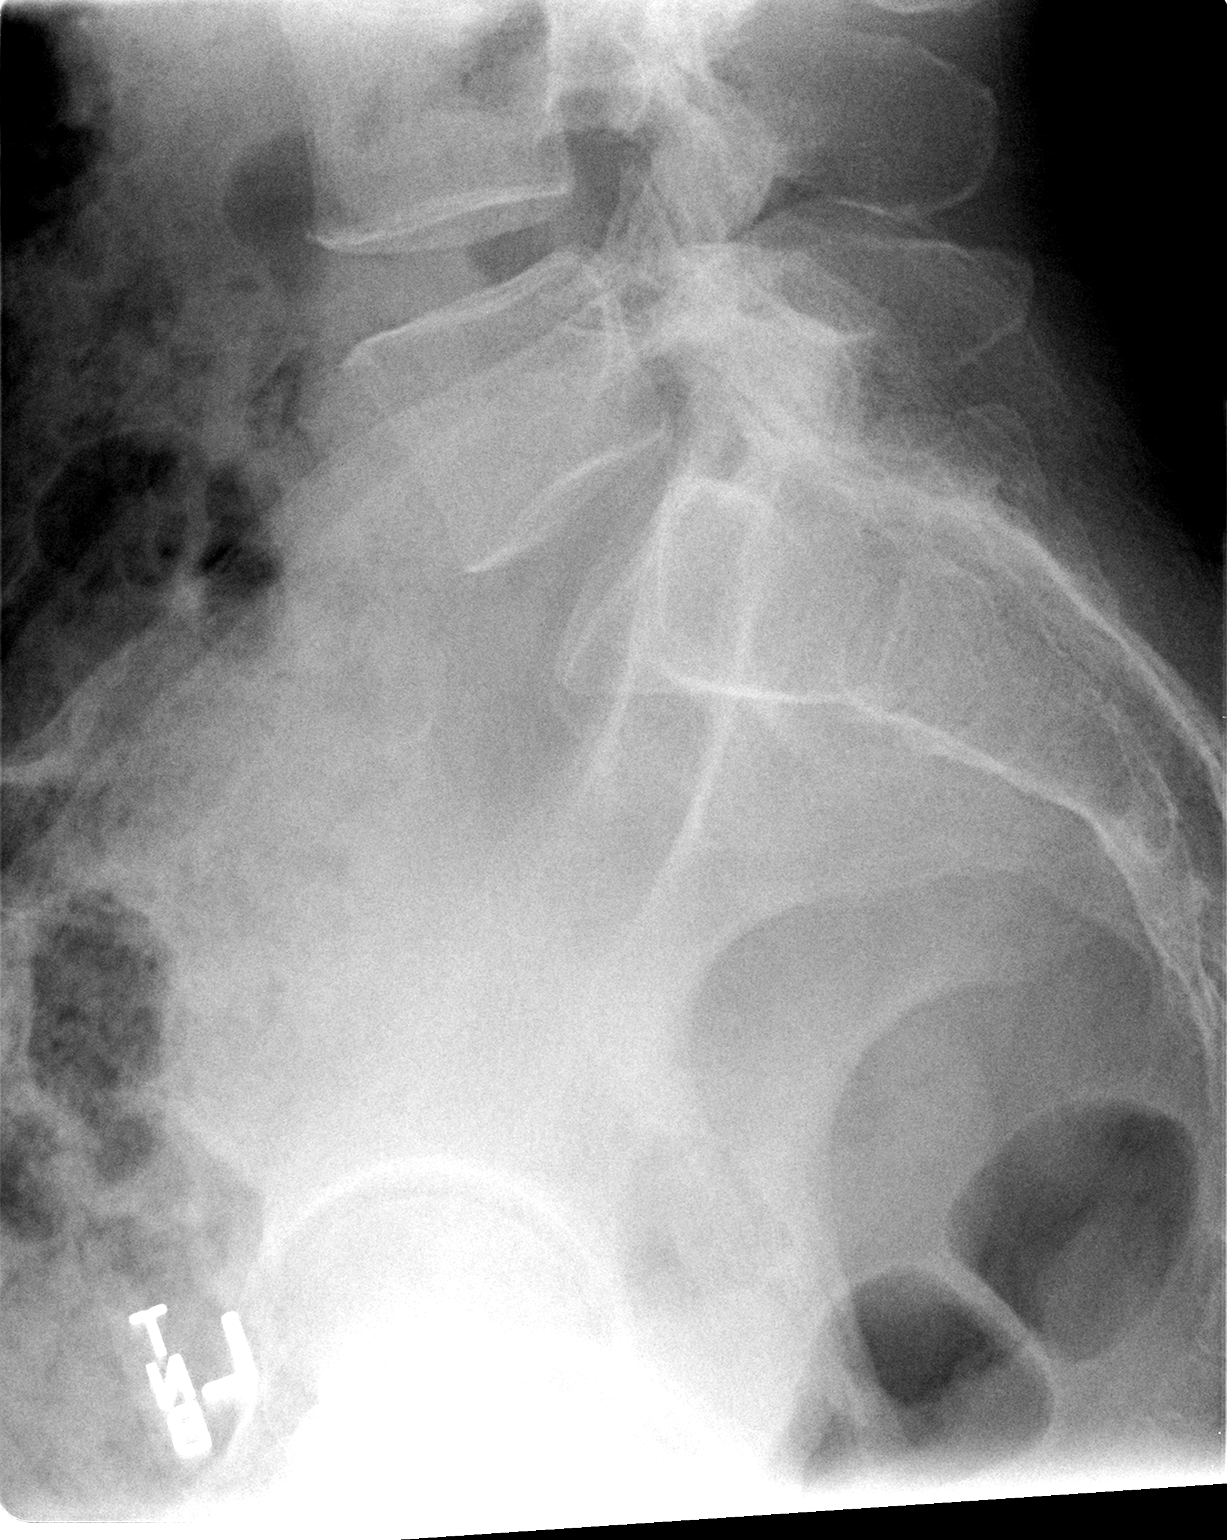

[3 of 3 positions shown; findings below may reference images not displayed]

FINDINGS: Paraspinal soft tissues are normal. Diffuse mild degenerative change
. Normal alignment. Mild L1 compression fracture, age undetermined.
No other focal abnormality.
IMPRESSION: Mild L1 compression fracture, age undetermined .

## 2016-11-16 ENCOUNTER — Other Ambulatory Visit: Payer: Self-pay | Admitting: Adult Health

## 2016-11-26 ENCOUNTER — Other Ambulatory Visit: Payer: Self-pay | Admitting: Adult Health

## 2017-06-21 ENCOUNTER — Other Ambulatory Visit: Payer: Self-pay | Admitting: Adult Health

## 2017-07-27 ENCOUNTER — Other Ambulatory Visit: Payer: Self-pay | Admitting: Adult Health

## 2017-08-11 ENCOUNTER — Other Ambulatory Visit: Payer: 59 | Admitting: Adult Health

## 2017-08-12 ENCOUNTER — Other Ambulatory Visit: Payer: 59 | Admitting: Adult Health

## 2017-08-24 ENCOUNTER — Encounter: Payer: Self-pay | Admitting: Adult Health

## 2017-08-24 ENCOUNTER — Other Ambulatory Visit: Payer: Self-pay

## 2017-08-24 ENCOUNTER — Encounter (INDEPENDENT_AMBULATORY_CARE_PROVIDER_SITE_OTHER): Payer: Self-pay

## 2017-08-24 ENCOUNTER — Ambulatory Visit (INDEPENDENT_AMBULATORY_CARE_PROVIDER_SITE_OTHER): Payer: 59 | Admitting: Adult Health

## 2017-08-24 VITALS — BP 126/82 | HR 72 | Ht 62.5 in | Wt 129.0 lb

## 2017-08-24 DIAGNOSIS — Z01419 Encounter for gynecological examination (general) (routine) without abnormal findings: Secondary | ICD-10-CM

## 2017-08-24 DIAGNOSIS — Z1212 Encounter for screening for malignant neoplasm of rectum: Secondary | ICD-10-CM

## 2017-08-24 DIAGNOSIS — Z01411 Encounter for gynecological examination (general) (routine) with abnormal findings: Secondary | ICD-10-CM | POA: Diagnosis not present

## 2017-08-24 DIAGNOSIS — Z79890 Hormone replacement therapy (postmenopausal): Secondary | ICD-10-CM | POA: Diagnosis not present

## 2017-08-24 DIAGNOSIS — Z1211 Encounter for screening for malignant neoplasm of colon: Secondary | ICD-10-CM | POA: Diagnosis not present

## 2017-08-24 LAB — HEMOCCULT GUIAC POC 1CARD (OFFICE): Fecal Occult Blood, POC: NEGATIVE

## 2017-08-24 NOTE — Progress Notes (Signed)
Patient ID: [name], female   DOB: Oct 25, 1955, 62 y.o.   MRN: [id number] History of Present Illness: [name] is a 62 year old white female, married, PM in for a well woman gyn exam,she had a normal pap with negative HPV 08/06/15. PCP is Dr Hilma Favors.    Current Medications, Allergies, Past Medical History, Past Surgical History, Family History and Social History were reviewed in [employer] record.     Review of Systems:  Patient denies any headaches, hearing loss, fatigue, blurred vision, shortness of breath, chest pain, abdominal pain, problems with bowel movements, urination, or intercourse. No joint pain or mood swings.   Physical Exam:BP 126/82 (BP Location: Right Arm, Patient Position: Sitting, Cuff Size: Normal)   Pulse 72   Ht 5' 2.5" (1.588 m)   Wt 129 lb (58.5 kg)   BMI 23.22 kg/m  General:  Well developed, well nourished, no acute distress Skin:  Warm and dry Neck:  Midline trachea, normal thyroid, good ROM, no lymphadenopathy,no carotid bruits heard Lungs; Clear to auscultation bilaterally Breast:  No dominant palpable mass, retraction, or nipple discharge,has bilateral implants Cardiovascular: Regular rate and rhythm Abdomen:  Soft, non tender, no hepatosplenomegaly Pelvic:  External genitalia is normal in appearance, no lesions.  The vagina is pale, with loss of moisture and rugae. Urethra has no lesions or masses. The cervix is smooth.  Uterus is felt to be normal size, shape, and contour.  No adnexal masses or tenderness noted.Bladder is non tender, no masses felt. Rectal: Good sphincter tone, no polyps, or hemorrhoids felt.  Hemoccult negative. Extremities/musculoskeletal:  No swelling or varicosities noted, no clubbing or cyanosis Psych:  No mood changes, alert and cooperative,seems happy PHQ 2 score 0.  Impression: 1. Encounter for well woman exam with routine gynecological exam   2. Screening for colorectal cancer   3. Hormone  replacement therapy (HRT)       Plan: Pap and physical in 1 year Mammogram yearly Labs per PCP Has refills on HRT

## 2017-10-19 ENCOUNTER — Other Ambulatory Visit: Payer: Self-pay | Admitting: Adult Health

## 2018-01-01 ENCOUNTER — Other Ambulatory Visit: Payer: Self-pay

## 2018-01-01 ENCOUNTER — Encounter (HOSPITAL_COMMUNITY): Payer: Self-pay | Admitting: Emergency Medicine

## 2018-01-01 ENCOUNTER — Emergency Department (HOSPITAL_COMMUNITY)
Admission: EM | Admit: 2018-01-01 | Discharge: 2018-01-01 | Disposition: A | Discharge status: Home / Self Care | Payer: 59 | Attending: Emergency Medicine | Admitting: Emergency Medicine

## 2018-01-01 DIAGNOSIS — L299 Pruritus, unspecified: Secondary | ICD-10-CM | POA: Diagnosis not present

## 2018-01-01 DIAGNOSIS — Z79899 Other long term (current) drug therapy: Secondary | ICD-10-CM | POA: Insufficient documentation

## 2018-01-01 DIAGNOSIS — R21 Rash and other nonspecific skin eruption: Secondary | ICD-10-CM | POA: Diagnosis not present

## 2018-01-01 MED ORDER — IVERMECTIN 3 MG PO TABS: 200.0000 ug/kg | ORAL_TABLET | Freq: Once | ORAL | 1 refills | Status: AC

## 2018-01-01 MED ORDER — PERMETHRIN 5 % EX CREA: TOPICAL_CREAM | CUTANEOUS | 1 refills | Status: DC

## 2018-01-01 NOTE — ED Provider Notes (Signed)
[facility] EMERGENCY DEPARTMENT Provider Note   CSN: [id number] Arrival date & time: 01/01/18  2703     History   Chief Complaint Chief Complaint  Patient presents with  . Rash    HPI [name] is a 62 y.o. female.  HPI  62 year old female, she presents today with a complaint of itching and rash.  She reports that about a week ago she was at her grandchild's party, there was lots of other children there, she was interacting with them, shortly thereafter she developed a rash which was very itchy across her back and around her buttock and waistline, it is gradually spread and is now on both of her arms left more than right.  Very pruritic, she is taking Benadryl with minimal improvement.  She has no other signs of allergic reaction, no wheezing or shortness of breath.  No lesions in the mouth.  These lesions are spreading down her legs but are not on her hands or feet.  She has not had any medication except for Benadryl for the itching.  Symptoms are persistent, gradually worsening.  Past Medical History:  Diagnosis Date  . Breast mass, right 10/03/2015  . Cyst of right breast 07/24/2015   Has 7 mm complex cyst on right, ?oill cyst follow up in 6 months at [facility]  . Hormone replacement therapy (HRT) 08/02/2014  . Hyperlipidemia   . Migraine    visual  . MS (multiple sclerosis) ([place])   . Thyroid disease     Patient Active Problem List   Diagnosis Date Noted  . Screening for colorectal cancer 08/24/2017  . Breast mass, right 10/03/2015  . Cyst of right breast 07/24/2015  . Hormone replacement therapy (HRT) 08/02/2014  . MS (multiple sclerosis) ([place]) 07/07/2013  . ANKLE SPRAIN 04/23/2009    Past Surgical History:  Procedure Laterality Date  . BREAST SURGERY     breast augmentation  . DILATION AND CURETTAGE OF UTERUS       OB History    Gravida  3   Para  2   Term      Preterm      AB  1   Living  2     SAB  1   TAB      Ectopic      Multiple     Live Births  2            Home Medications    Prior to Admission medications   Medication Sig Start Date End Date Taking? Authorizing Provider  buPROPion (WELLBUTRIN XL) 300 MG 24 hr tablet Take 300 mg by mouth daily.    [provider]  escitalopram (LEXAPRO) 20 MG tablet Take 20 mg by mouth daily.    [provider]  estradiol (ESTRACE) 0.5 MG tablet TAKE ONE TABLET BY MOUTH DAILY. 07/27/17   Estill Dooms, NP  fexofenadine (ALLEGRA) 180 MG tablet Take 180 mg by mouth daily.    [provider]  fluticasone (FLONASE) 50 MCG/ACT nasal spray Place 2 sprays into both nostrils as needed for allergies or rhinitis.    [provider]  ibuprofen (ADVIL,MOTRIN) 200 MG tablet Take 400 mg by mouth as needed.    [provider]  ivermectin (STROMECTOL) 3 MG TABS tablet Take 3.5 tablets (10,500 mcg total) by mouth once for 1 dose. Repeat in 2 weeks if still having symptoms. 01/01/18 01/01/18  Noemi Chapel, MD  levothyroxine (SYNTHROID, LEVOTHROID) 75 MCG tablet Take 75 mcg  by mouth daily before breakfast.    [provider]  Niacinamide-Zinc-Copper-FA (NICOTINAMIDE ZCF PO) Take by mouth daily.     [provider]  permethrin (ELIMITE) 5 % cream Apply to entire body other than face - let sit for 14 hours then wash off, may repeat in 1 week if still having symptoms 01/01/18   Noemi Chapel, MD  progesterone (PROMETRIUM) 100 MG capsule TAKE 2 CAPSULES BY MOUTH DAILY. 06/21/17   Estill Dooms, NP  Testosterone 10 MG/ACT (2%) GEL APPLY 5.10 ML 1 CLICK TO 2.58 ML 2 CLICKS DAILY BEHIND THE KNEE. 10/19/17   Estill Dooms, NP  Testosterone Propionate POWD APPLY 5.27 ML (1 CLICK) TO 7.82 ML (2 CLICKS) DAILY BEHIND THE KNEE. Patient not taking: Reported on 08/06/2016 12/12/15   Estill Dooms, NP    Family History Family History  Problem Relation Age of Onset  . Hypertension Mother   . Other Mother        spinal stenosis  .  Alzheimer's disease Father   . Cancer Father        prancreatic  . Hypertension Brother   . Hypertension Daughter   . Heart disease Maternal Grandmother   . Heart disease Maternal Grandfather   . Cancer Paternal Grandmother        ovarian and colon  . Cancer Paternal Grandfather        prostate    Social History Social History   Tobacco Use  . Smoking status: Never Smoker  . Smokeless tobacco: Never Used  Substance Use Topics  . Alcohol use: Yes    Comment: social  . Drug use: No     Allergies   Patient has no known allergies.   Review of Systems Review of Systems  Constitutional: Negative for fever.  Skin: Positive for rash.     Physical Exam Updated Vital Signs BP (!) 141/92 (BP Location: Right Arm)   Pulse 90   Temp 97.7 F (36.5 C) (Oral)   Resp 16   Ht 5\' 2"  (1.575 m)   Wt 56.2 kg   SpO2 99%   BMI 22.68 kg/m   Physical Exam  Constitutional: She appears well-developed and well-nourished. No distress.  HENT:  Head: Normocephalic and atraumatic.  Eyes: Conjunctivae are normal. Right eye exhibits no discharge. Left eye exhibits no discharge. No scleral icterus.  Cardiovascular: Intact distal pulses.  Pulmonary/Chest: Effort normal.  Neurological: She is alert. Coordination normal.  Skin:  There is an erythematous papular excoriated and scabbed over rash over her posterior thorax and lower back as well as her bilateral arms near the antecubital fossa but spreading down to the wrist and up the arm.  No lesions on the face.  Nursing note and vitals reviewed.    ED Treatments / Results  Labs (all labs ordered are listed, but only abnormal results are displayed) Labs Reviewed - No data to display  EKG None  Radiology No results found.  Procedures Procedures (including critical care time)  Medications Ordered in ED Medications - No data to display   Initial Impression / Assessment and Plan / ED Course  I have reviewed the triage vital  signs and the nursing notes.  Pertinent labs & imaging results that were available during my care of the patient were reviewed by me and considered in my medical decision making (see chart for details).    Rash that has an appearance consistent with a scabies type rash.  Will give ivermectin and  permethrin cream.  Patient agreeable, no other signs of acute allergic reaction or anaphylaxis.  No other topical exposures, the patient denies any new medications, foods.  She does have a history of allergies requiring allergy injections but no longer takes them.  This does not appear like typical urticarial rash but more likely to be scabies  Final Clinical Impressions(s) / ED Diagnoses   Final diagnoses:  Generalized rash    ED Discharge Orders         Ordered    permethrin (ELIMITE) 5 % cream     01/01/18 1007    ivermectin (STROMECTOL) 3 MG TABS tablet   Once     01/01/18 1007           Noemi Chapel, MD 01/01/18 1007

## 2018-01-01 NOTE — Discharge Instructions (Signed)
Please apply permethrin cream as prescribed, you may repeat in 1 week if still having symptoms, also take ivermectin 3-1/2 tablets this evening, you may repeat this in 2 weeks as needed.  Please stay away from others as you are contagious if this is truly scabies.  Please read the attached instructions.

## 2018-01-01 NOTE — ED Triage Notes (Signed)
Patient c/o rash to left arm, abd and back. Per patient appeared a week ago "with a few bumps on left arm" and is progressively getting worse. Per patient itching. Patient reports using benadryl and hydrocortisone cream with no relief. Denies using any new medications, detergents, soaps, lotions, food, etc. Patient does report increase in stress lately.

## 2018-01-21 ENCOUNTER — Other Ambulatory Visit: Payer: Self-pay | Admitting: Adult Health

## 2018-05-22 ENCOUNTER — Other Ambulatory Visit: Payer: Self-pay | Admitting: Adult Health

## 2018-06-20 ENCOUNTER — Telehealth: Payer: Self-pay | Admitting: *Deleted

## 2018-06-20 DIAGNOSIS — G35 Multiple sclerosis: Secondary | ICD-10-CM | POA: Diagnosis not present

## 2018-06-20 DIAGNOSIS — Z1389 Encounter for screening for other disorder: Secondary | ICD-10-CM | POA: Diagnosis not present

## 2018-06-20 DIAGNOSIS — F321 Major depressive disorder, single episode, moderate: Secondary | ICD-10-CM | POA: Diagnosis not present

## 2018-06-20 DIAGNOSIS — Z86010 Personal history of colonic polyps: Secondary | ICD-10-CM | POA: Diagnosis not present

## 2018-06-20 DIAGNOSIS — E039 Hypothyroidism, unspecified: Secondary | ICD-10-CM | POA: Diagnosis not present

## 2018-06-20 DIAGNOSIS — Z6822 Body mass index (BMI) 22.0-22.9, adult: Secondary | ICD-10-CM | POA: Diagnosis not present

## 2018-06-20 DIAGNOSIS — L719 Rosacea, unspecified: Secondary | ICD-10-CM | POA: Diagnosis not present

## 2018-06-20 MED ORDER — PROGESTERONE MICRONIZED 100 MG PO CAPS: 200.0000 mg | ORAL_CAPSULE | Freq: Every day | ORAL | 3 refills | Status: DC

## 2018-06-20 NOTE — Telephone Encounter (Signed)
Refill Prometrium

## 2018-07-19 ENCOUNTER — Encounter: Payer: Self-pay | Admitting: Adult Health

## 2018-07-19 DIAGNOSIS — L720 Epidermal cyst: Secondary | ICD-10-CM | POA: Diagnosis not present

## 2018-07-19 DIAGNOSIS — L739 Follicular disorder, unspecified: Secondary | ICD-10-CM | POA: Diagnosis not present

## 2018-07-19 DIAGNOSIS — D2261 Melanocytic nevi of right upper limb, including shoulder: Secondary | ICD-10-CM | POA: Diagnosis not present

## 2018-07-19 DIAGNOSIS — D485 Neoplasm of uncertain behavior of skin: Secondary | ICD-10-CM | POA: Diagnosis not present

## 2018-07-19 DIAGNOSIS — L738 Other specified follicular disorders: Secondary | ICD-10-CM | POA: Diagnosis not present

## 2018-07-19 DIAGNOSIS — L814 Other melanin hyperpigmentation: Secondary | ICD-10-CM | POA: Diagnosis not present

## 2018-07-19 DIAGNOSIS — Z1231 Encounter for screening mammogram for malignant neoplasm of breast: Secondary | ICD-10-CM | POA: Diagnosis not present

## 2018-07-19 DIAGNOSIS — D225 Melanocytic nevi of trunk: Secondary | ICD-10-CM | POA: Diagnosis not present

## 2018-07-19 DIAGNOSIS — L821 Other seborrheic keratosis: Secondary | ICD-10-CM | POA: Diagnosis not present

## 2018-07-19 DIAGNOSIS — L570 Actinic keratosis: Secondary | ICD-10-CM | POA: Diagnosis not present

## 2018-07-20 DIAGNOSIS — G373 Acute transverse myelitis in demyelinating disease of central nervous system: Secondary | ICD-10-CM | POA: Diagnosis not present

## 2018-08-11 ENCOUNTER — Ambulatory Visit: Payer: Medicare Other | Admitting: General Surgery

## 2018-08-29 ENCOUNTER — Other Ambulatory Visit: Payer: Self-pay | Admitting: Adult Health

## 2018-09-12 ENCOUNTER — Other Ambulatory Visit: Payer: Self-pay | Admitting: Obstetrics & Gynecology

## 2018-09-19 ENCOUNTER — Telehealth: Payer: Self-pay | Admitting: *Deleted

## 2018-09-19 MED ORDER — ESTRADIOL 0.5 MG PO TABS: 0.5000 mg | ORAL_TABLET | Freq: Every day | ORAL | 12 refills | Status: DC

## 2018-09-19 NOTE — Telephone Encounter (Signed)
Pt checking status on refill.

## 2018-09-19 NOTE — Telephone Encounter (Signed)
Pt requesting refill on Estradiol 0.5 mg. Please advise. Thanks! Sayre

## 2018-09-19 NOTE — Telephone Encounter (Signed)
Refilled estrace 

## 2018-09-27 ENCOUNTER — Telehealth: Payer: Self-pay | Admitting: *Deleted

## 2018-09-27 DIAGNOSIS — B351 Tinea unguium: Secondary | ICD-10-CM | POA: Diagnosis not present

## 2018-09-27 NOTE — Telephone Encounter (Signed)
Pharmacy made aware PA approved for Estradiol

## 2018-10-19 DIAGNOSIS — Z1389 Encounter for screening for other disorder: Secondary | ICD-10-CM | POA: Diagnosis not present

## 2018-10-19 DIAGNOSIS — Z0000 Encounter for general adult medical examination without abnormal findings: Secondary | ICD-10-CM | POA: Diagnosis not present

## 2018-10-19 DIAGNOSIS — Z6823 Body mass index (BMI) 23.0-23.9, adult: Secondary | ICD-10-CM | POA: Diagnosis not present

## 2018-10-19 DIAGNOSIS — B351 Tinea unguium: Secondary | ICD-10-CM | POA: Diagnosis not present

## 2018-10-26 ENCOUNTER — Other Ambulatory Visit: Payer: Self-pay | Admitting: Adult Health

## 2019-02-24 ENCOUNTER — Other Ambulatory Visit: Payer: Self-pay | Admitting: Adult Health

## 2019-03-02 DIAGNOSIS — Z23 Encounter for immunization: Secondary | ICD-10-CM | POA: Diagnosis not present

## 2019-04-24 DIAGNOSIS — H109 Unspecified conjunctivitis: Secondary | ICD-10-CM | POA: Diagnosis not present

## 2019-05-15 ENCOUNTER — Other Ambulatory Visit: Payer: Self-pay | Admitting: *Deleted

## 2019-05-23 ENCOUNTER — Telehealth: Payer: Self-pay | Admitting: *Deleted

## 2019-05-23 MED ORDER — TESTOSTERONE 10 MG/ACT (2%) TD GEL: TRANSDERMAL | 5 refills | Status: DC

## 2019-05-23 NOTE — Telephone Encounter (Signed)
Refilled testosterone cream at her request

## 2019-05-23 NOTE — Telephone Encounter (Signed)
Patient called requesting refill for testosterone.

## 2019-06-09 DIAGNOSIS — Z23 Encounter for immunization: Secondary | ICD-10-CM | POA: Diagnosis not present

## 2019-06-14 DIAGNOSIS — L820 Inflamed seborrheic keratosis: Secondary | ICD-10-CM | POA: Diagnosis not present

## 2019-06-26 ENCOUNTER — Other Ambulatory Visit: Payer: Self-pay | Admitting: Adult Health

## 2019-07-07 DIAGNOSIS — Z23 Encounter for immunization: Secondary | ICD-10-CM | POA: Diagnosis not present

## 2019-07-24 ENCOUNTER — Encounter: Payer: Self-pay | Admitting: Adult Health

## 2019-07-24 DIAGNOSIS — Z1231 Encounter for screening mammogram for malignant neoplasm of breast: Secondary | ICD-10-CM | POA: Diagnosis not present

## 2019-08-15 DIAGNOSIS — D225 Melanocytic nevi of trunk: Secondary | ICD-10-CM | POA: Diagnosis not present

## 2019-08-15 DIAGNOSIS — L821 Other seborrheic keratosis: Secondary | ICD-10-CM | POA: Diagnosis not present

## 2019-08-15 DIAGNOSIS — Z85828 Personal history of other malignant neoplasm of skin: Secondary | ICD-10-CM | POA: Diagnosis not present

## 2019-08-15 DIAGNOSIS — D2271 Melanocytic nevi of right lower limb, including hip: Secondary | ICD-10-CM | POA: Diagnosis not present

## 2019-08-15 DIAGNOSIS — D2261 Melanocytic nevi of right upper limb, including shoulder: Secondary | ICD-10-CM | POA: Diagnosis not present

## 2019-08-15 DIAGNOSIS — L718 Other rosacea: Secondary | ICD-10-CM | POA: Diagnosis not present

## 2019-08-15 DIAGNOSIS — L814 Other melanin hyperpigmentation: Secondary | ICD-10-CM | POA: Diagnosis not present

## 2019-08-15 DIAGNOSIS — L570 Actinic keratosis: Secondary | ICD-10-CM | POA: Diagnosis not present

## 2019-08-15 DIAGNOSIS — D1801 Hemangioma of skin and subcutaneous tissue: Secondary | ICD-10-CM | POA: Diagnosis not present

## 2019-09-04 ENCOUNTER — Telehealth: Payer: Self-pay | Admitting: Adult Health

## 2019-09-04 NOTE — Telephone Encounter (Signed)

## 2019-09-05 ENCOUNTER — Other Ambulatory Visit (HOSPITAL_COMMUNITY)
Admission: RE | Admit: 2019-09-05 | Discharge: 2019-09-05 | Disposition: A | Discharge status: Home / Self Care | Payer: Medicare Other | Source: Ambulatory Visit | Attending: Adult Health | Admitting: Adult Health

## 2019-09-05 ENCOUNTER — Ambulatory Visit (INDEPENDENT_AMBULATORY_CARE_PROVIDER_SITE_OTHER): Payer: Medicare Other | Admitting: Adult Health

## 2019-09-05 ENCOUNTER — Other Ambulatory Visit: Payer: Self-pay

## 2019-09-05 ENCOUNTER — Encounter: Payer: Self-pay | Admitting: Adult Health

## 2019-09-05 VITALS — BP 144/94 | HR 75 | Ht 62.0 in | Wt 128.6 lb

## 2019-09-05 DIAGNOSIS — Z79890 Hormone replacement therapy (postmenopausal): Secondary | ICD-10-CM

## 2019-09-05 DIAGNOSIS — E782 Mixed hyperlipidemia: Secondary | ICD-10-CM | POA: Diagnosis not present

## 2019-09-05 DIAGNOSIS — Z1212 Encounter for screening for malignant neoplasm of rectum: Secondary | ICD-10-CM

## 2019-09-05 DIAGNOSIS — G35 Multiple sclerosis: Secondary | ICD-10-CM

## 2019-09-05 DIAGNOSIS — Z124 Encounter for screening for malignant neoplasm of cervix: Secondary | ICD-10-CM

## 2019-09-05 DIAGNOSIS — Z01419 Encounter for gynecological examination (general) (routine) without abnormal findings: Secondary | ICD-10-CM

## 2019-09-05 DIAGNOSIS — L659 Nonscarring hair loss, unspecified: Secondary | ICD-10-CM | POA: Diagnosis not present

## 2019-09-05 DIAGNOSIS — Z13220 Encounter for screening for lipoid disorders: Secondary | ICD-10-CM | POA: Insufficient documentation

## 2019-09-05 DIAGNOSIS — Z1211 Encounter for screening for malignant neoplasm of colon: Secondary | ICD-10-CM | POA: Diagnosis not present

## 2019-09-05 DIAGNOSIS — Z1151 Encounter for screening for human papillomavirus (HPV): Secondary | ICD-10-CM | POA: Diagnosis not present

## 2019-09-05 DIAGNOSIS — Z780 Asymptomatic menopausal state: Secondary | ICD-10-CM | POA: Diagnosis not present

## 2019-09-05 DIAGNOSIS — Z1321 Encounter for screening for nutritional disorder: Secondary | ICD-10-CM | POA: Insufficient documentation

## 2019-09-05 LAB — HEMOCCULT GUIAC POC 1CARD (OFFICE): Fecal Occult Blood, POC: NEGATIVE

## 2019-09-05 NOTE — Progress Notes (Signed)
Patient ID: [name], female   DOB: [birth year], 64 y.o.   MRN: [id number] History of Present Illness: [name] is a 64 year old white female, married, PM in for a well woman gyn exam and pap, and requests labs. PCP is Dr Hilma Favors.   Current Medications, Allergies, Past Medical History, Past Surgical History, Family History and Social History were reviewed in [employer] record.     Review of Systems: Patient denies any headaches, hearing loss, fatigue, blurred vision, shortness of breath, chest pain, abdominal pain, problems with bowel movements, urination, or intercourse. No joint pain or mood swings. She is happy with HRT.    Physical Exam:BP (!) 144/94 (BP Location: Left Arm, Patient Position: Sitting, Cuff Size: Normal)   Pulse 75   Ht 5\' 2"  (1.575 m)   Wt 128 lb 9.6 oz (58.3 kg)   BMI 23.52 kg/m  General:  Well developed, well nourished, no acute distress Skin:  Warm and dry Neck:  Midline trachea, normal thyroid, good ROM, no lymphadenopathy,no carotid bruits heard  Lungs; Clear to auscultation bilaterally Breast:  No dominant palpable mass, retraction, or nipple discharge,has bilateral implants  Cardiovascular: Regular rate and rhythm Abdomen:  Soft, non tender, no hepatosplenomegaly Pelvic:  External genitalia is normal in appearance, no lesions.  The vagina is pale with loss of moisture and rugae. Urethra has no lesions or masses. The cervix is smooth, pap with hight risk HPV 16/18 genotyping performed.  Uterus is felt to be normal size, shape, and contour.  No adnexal masses or tenderness noted.Bladder is non tender, no masses felt. Rectal: Good sphincter tone, no polyps, or hemorrhoids felt.  Hemoccult negative. Extremities/musculoskeletal:  No swelling or varicosities noted, no clubbing or cyanosis Psych:  No mood changes, alert and cooperative,seems happy AA 2 Fall risk is low PHQ 9 score is 1. Examination chaperoned by Rolena Infante LPN    Impression and Plan:  1. Routine cervical smear  2. Encounter for gynecological examination with Papanicolaou smear of cervix Pap sent  Physical in 2 years Pap in 3 if normal Mammogram yearly Check CBC and CMP  3. Screening for colorectal cancer Colonoscopy per GI  4. Hormone replacement therapy (HRT) Has refills on HRT  5. MS (multiple sclerosis) ([place])  6. Hair thinning Check TSH and free T4  7. Elevated cholesterol with elevated triglycerides Check lipid panel

## 2019-09-06 LAB — COMPREHENSIVE METABOLIC PANEL
ALT: 8 IU/L (ref 0–32)
AST: 13 IU/L (ref 0–40)
Albumin/Globulin Ratio: 1.8 (ref 1.2–2.2)
Albumin: 4.7 g/dL (ref 3.8–4.8)
Alkaline Phosphatase: 75 IU/L (ref 39–117)
BUN/Creatinine Ratio: 20 (ref 12–28)
BUN: 18 mg/dL (ref 8–27)
Bilirubin Total: 0.3 mg/dL (ref 0.0–1.2)
CO2: 27 mmol/L (ref 20–29)
Calcium: 9.5 mg/dL (ref 8.7–10.3)
Chloride: 101 mmol/L (ref 96–106)
Creatinine, Ser: 0.91 mg/dL (ref 0.57–1.00)
GFR calc Af Amer: 76 mL/min/{1.73_m2} (ref >59)
GFR calc non Af Amer: 66 mL/min/{1.73_m2} (ref >59)
Globulin, Total: 2.6 g/dL (ref 1.5–4.5)
Glucose: 85 mg/dL (ref 65–99)
Potassium: 4.1 mmol/L (ref 3.5–5.2)
Sodium: 139 mmol/L (ref 134–144)
Total Protein: 7.3 g/dL (ref 6.0–8.5)

## 2019-09-06 LAB — CBC
Hematocrit: 40.5 % (ref 34.0–46.6)
Hemoglobin: 13.6 g/dL (ref 11.1–15.9)
MCH: 30.2 pg (ref 26.6–33.0)
MCHC: 33.6 g/dL (ref 31.5–35.7)
MCV: 90 fL (ref 79–97)
Platelets: 352 10*3/uL (ref 150–450)
RBC: 4.5 x10E6/uL (ref 3.77–5.28)
RDW: 12.5 % (ref 11.7–15.4)
WBC: 6.1 10*3/uL (ref 3.4–10.8)

## 2019-09-06 LAB — LIPID PANEL
Chol/HDL Ratio: 3.8 ratio (ref 0.0–4.4)
Cholesterol, Total: 268 mg/dL — ABNORMAL HIGH (ref 100–199)
HDL: 70 mg/dL (ref >39)
LDL Chol Calc (NIH): 165 mg/dL — ABNORMAL HIGH (ref 0–99)
Triglycerides: 183 mg/dL — ABNORMAL HIGH (ref 0–149)
VLDL Cholesterol Cal: 33 mg/dL (ref 5–40)

## 2019-09-06 LAB — TSH: TSH: 2.24 u[IU]/mL (ref 0.450–4.500)

## 2019-09-06 LAB — T4, FREE: Free T4: 1.56 ng/dL (ref 0.82–1.77)

## 2019-09-07 ENCOUNTER — Telehealth: Payer: Self-pay | Admitting: Adult Health

## 2019-09-07 LAB — CYTOLOGY - PAP
Comment: NEGATIVE
Diagnosis: NEGATIVE
High risk HPV: NEGATIVE

## 2019-09-07 NOTE — Telephone Encounter (Signed)
[name] is a aware that pap is negative for malignancy and HPV and labs good, cholesterol still elevated but better.

## 2019-09-19 ENCOUNTER — Other Ambulatory Visit: Payer: Self-pay | Admitting: Adult Health

## 2019-09-21 DIAGNOSIS — Z6823 Body mass index (BMI) 23.0-23.9, adult: Secondary | ICD-10-CM | POA: Diagnosis not present

## 2019-09-21 DIAGNOSIS — F329 Major depressive disorder, single episode, unspecified: Secondary | ICD-10-CM | POA: Diagnosis not present

## 2019-09-21 DIAGNOSIS — E039 Hypothyroidism, unspecified: Secondary | ICD-10-CM | POA: Diagnosis not present

## 2019-10-29 ENCOUNTER — Other Ambulatory Visit: Payer: Self-pay | Admitting: Adult Health

## 2019-11-05 DIAGNOSIS — T148XXA Other injury of unspecified body region, initial encounter: Secondary | ICD-10-CM | POA: Diagnosis not present

## 2019-11-05 DIAGNOSIS — L089 Local infection of the skin and subcutaneous tissue, unspecified: Secondary | ICD-10-CM | POA: Diagnosis not present

## 2019-11-05 DIAGNOSIS — S90851A Superficial foreign body, right foot, initial encounter: Secondary | ICD-10-CM | POA: Diagnosis not present

## 2019-11-21 DIAGNOSIS — H59811 Chorioretinal scars after surgery for detachment, right eye: Secondary | ICD-10-CM | POA: Diagnosis not present

## 2019-11-21 DIAGNOSIS — H33011 Retinal detachment with single break, right eye: Secondary | ICD-10-CM | POA: Diagnosis not present

## 2019-12-04 DIAGNOSIS — H33311 Horseshoe tear of retina without detachment, right eye: Secondary | ICD-10-CM | POA: Diagnosis not present

## 2019-12-04 DIAGNOSIS — H35371 Puckering of macula, right eye: Secondary | ICD-10-CM | POA: Diagnosis not present

## 2019-12-16 ENCOUNTER — Other Ambulatory Visit: Payer: Self-pay | Admitting: Obstetrics & Gynecology

## 2020-01-31 DIAGNOSIS — J301 Allergic rhinitis due to pollen: Secondary | ICD-10-CM | POA: Diagnosis not present

## 2020-01-31 DIAGNOSIS — J3089 Other allergic rhinitis: Secondary | ICD-10-CM | POA: Diagnosis not present

## 2020-01-31 DIAGNOSIS — H1045 Other chronic allergic conjunctivitis: Secondary | ICD-10-CM | POA: Diagnosis not present

## 2020-02-13 DIAGNOSIS — Z0001 Encounter for general adult medical examination with abnormal findings: Secondary | ICD-10-CM | POA: Diagnosis not present

## 2020-02-13 DIAGNOSIS — Z23 Encounter for immunization: Secondary | ICD-10-CM | POA: Diagnosis not present

## 2020-02-13 DIAGNOSIS — Z6824 Body mass index (BMI) 24.0-24.9, adult: Secondary | ICD-10-CM | POA: Diagnosis not present

## 2020-02-13 DIAGNOSIS — E7849 Other hyperlipidemia: Secondary | ICD-10-CM | POA: Diagnosis not present

## 2020-02-13 DIAGNOSIS — Z1389 Encounter for screening for other disorder: Secondary | ICD-10-CM | POA: Diagnosis not present

## 2020-02-13 DIAGNOSIS — E039 Hypothyroidism, unspecified: Secondary | ICD-10-CM | POA: Diagnosis not present

## 2020-02-13 DIAGNOSIS — G35 Multiple sclerosis: Secondary | ICD-10-CM | POA: Diagnosis not present

## 2020-03-18 DIAGNOSIS — Z23 Encounter for immunization: Secondary | ICD-10-CM | POA: Diagnosis not present

## 2020-03-25 ENCOUNTER — Other Ambulatory Visit: Payer: Self-pay | Admitting: *Deleted

## 2020-03-25 MED ORDER — TESTOSTERONE 10 MG/ACT (2%) TD GEL: TRANSDERMAL | 0 refills | Status: DC

## 2020-04-01 ENCOUNTER — Telehealth: Payer: Self-pay | Admitting: *Deleted

## 2020-04-01 ENCOUNTER — Telehealth: Payer: Self-pay | Admitting: Women's Health

## 2020-04-01 NOTE — Telephone Encounter (Signed)
Pt called, stated that [facility] said they didn't get the Rx we sent on 03/25/2020 forTestosterone 10 MG/ACT (2%) GEL  Can we resend?  Please advise & notify pt

## 2020-04-01 NOTE — Telephone Encounter (Signed)
Patient notified cream has been called in to pharmacy.

## 2020-04-25 ENCOUNTER — Telehealth: Payer: Self-pay | Admitting: *Deleted

## 2020-04-25 NOTE — Telephone Encounter (Signed)
Patient states she had the Estradiol 0.5mg  filled in September.  Pharmacy says they gave her 66 but she only got 75.  She is going out of town on Saturday and is wanting to see if she can get a 7 day supply sent in to hold her over until [facility] can get this figured out.  Please advise.

## 2020-05-10 DIAGNOSIS — Z1159 Encounter for screening for other viral diseases: Secondary | ICD-10-CM | POA: Diagnosis not present

## 2020-05-10 DIAGNOSIS — K219 Gastro-esophageal reflux disease without esophagitis: Secondary | ICD-10-CM | POA: Diagnosis not present

## 2020-05-10 DIAGNOSIS — Z86010 Personal history of colonic polyps: Secondary | ICD-10-CM | POA: Diagnosis not present

## 2020-05-26 ENCOUNTER — Other Ambulatory Visit: Payer: Self-pay | Admitting: Adult Health

## 2020-06-04 DIAGNOSIS — Z01812 Encounter for preprocedural laboratory examination: Secondary | ICD-10-CM | POA: Diagnosis not present

## 2020-06-07 DIAGNOSIS — D123 Benign neoplasm of transverse colon: Secondary | ICD-10-CM | POA: Diagnosis not present

## 2020-06-07 DIAGNOSIS — Z86010 Personal history of colonic polyps: Secondary | ICD-10-CM | POA: Diagnosis not present

## 2020-06-07 DIAGNOSIS — D125 Benign neoplasm of sigmoid colon: Secondary | ICD-10-CM | POA: Diagnosis not present

## 2020-06-07 DIAGNOSIS — K5730 Diverticulosis of large intestine without perforation or abscess without bleeding: Secondary | ICD-10-CM | POA: Diagnosis not present

## 2020-06-12 DIAGNOSIS — D123 Benign neoplasm of transverse colon: Secondary | ICD-10-CM | POA: Diagnosis not present

## 2020-06-12 DIAGNOSIS — D125 Benign neoplasm of sigmoid colon: Secondary | ICD-10-CM | POA: Diagnosis not present

## 2020-07-29 ENCOUNTER — Other Ambulatory Visit: Payer: Self-pay

## 2020-07-29 ENCOUNTER — Encounter: Payer: Self-pay | Admitting: Adult Health

## 2020-07-29 MED ORDER — TESTOSTERONE 10 MG/ACT (2%) TD GEL: TRANSDERMAL | 5 refills | Status: DC

## 2020-07-30 ENCOUNTER — Telehealth: Payer: Self-pay | Admitting: Adult Health

## 2020-07-30 MED ORDER — TESTOSTERONE 10 MG/ACT (2%) TD GEL: TRANSDERMAL | 5 refills | Status: DC

## 2020-07-30 NOTE — Telephone Encounter (Signed)
Called patient and informed her that prescription had been sent to [facility].

## 2020-07-30 NOTE — Addendum Note (Signed)
Addended by: Derrek Monaco A on: 07/30/2020 01:37 PM   Modules accepted: Orders

## 2020-07-30 NOTE — Telephone Encounter (Signed)
Patient called stating that her medication for a testosterone cream was sent to [facility] by accident, pt states they do not do compound. Please sent the medication to [facility]. Contact pt when sent to [facility].

## 2020-07-30 NOTE — Telephone Encounter (Signed)
Rx sent to [facility] for testosterone cream

## 2020-08-06 DIAGNOSIS — F329 Major depressive disorder, single episode, unspecified: Secondary | ICD-10-CM | POA: Diagnosis not present

## 2020-08-06 DIAGNOSIS — Z23 Encounter for immunization: Secondary | ICD-10-CM | POA: Diagnosis not present

## 2020-08-06 DIAGNOSIS — Z6824 Body mass index (BMI) 24.0-24.9, adult: Secondary | ICD-10-CM | POA: Diagnosis not present

## 2020-08-06 DIAGNOSIS — G35 Multiple sclerosis: Secondary | ICD-10-CM | POA: Diagnosis not present

## 2020-08-06 DIAGNOSIS — E039 Hypothyroidism, unspecified: Secondary | ICD-10-CM | POA: Diagnosis not present

## 2020-08-10 ENCOUNTER — Ambulatory Visit
Admission: EM | Admit: 2020-08-10 | Discharge: 2020-08-10 | Disposition: A | Discharge status: Home / Self Care | Payer: Medicare Other | Attending: Emergency Medicine | Admitting: Emergency Medicine

## 2020-08-10 ENCOUNTER — Encounter: Payer: Self-pay | Admitting: Emergency Medicine

## 2020-08-10 ENCOUNTER — Other Ambulatory Visit: Payer: Self-pay

## 2020-08-10 DIAGNOSIS — R059 Cough, unspecified: Secondary | ICD-10-CM

## 2020-08-10 DIAGNOSIS — J029 Acute pharyngitis, unspecified: Secondary | ICD-10-CM | POA: Diagnosis not present

## 2020-08-10 DIAGNOSIS — R0981 Nasal congestion: Secondary | ICD-10-CM

## 2020-08-10 MED ORDER — PREDNISONE 10 MG (21) PO TBPK: ORAL_TABLET | Freq: Every day | ORAL | 0 refills | Status: DC

## 2020-08-10 MED ORDER — LIDOCAINE VISCOUS HCL 2 % MT SOLN: 15.0000 mL | OROMUCOSAL | 0 refills | Status: DC | PRN

## 2020-08-10 NOTE — Discharge Instructions (Signed)
Declines covid test Get plenty of rest and push fluids Prednisone for congestion Viscous lidocaine prescribed.  This is an oral solution you can swish, gargle, and/or swallow as needed for symptomatic relief of sore throat.  Do not exceed 8 doses in a 24 hour period.  Do not use prior to eating, as this will numb your entire mouth.    Use OTC zyrtec for nasal congestion, runny nose, and/or sore throat Use OTC flonase for nasal congestion and runny nose Use medications daily for symptom relief Use OTC medications like ibuprofen or tylenol as needed fever or pain Call or go to the ED if you have any new or worsening symptoms such as fever, worsening cough, shortness of breath, chest tightness, chest pain, turning blue, changes in mental status, etc..Marland Kitchen

## 2020-08-10 NOTE — ED Triage Notes (Signed)
received a pneumonia shot on Monday.  Symptoms started on Tuesday.  Sore throat, and chills

## 2020-08-10 NOTE — ED Provider Notes (Signed)
[facility]   [id number] 08/10/20 Arrival Time: 1009   CC: sore throat  SUBJECTIVE: History from: patient.  [name] is a 65 y.o. female who presents with sore throat, chills, and cough x 3 days.  Denies sick exposure to COVID, flu or strep.  Received PNA shot 4 days ago.  Has tried OTC medications without relief.  Symptoms are made worse with swallowing, but tolerating secretions and liquids.  Denies previous symptoms in the past.   Denies fever, SOB, wheezing, chest pain, nausea, changes in bowel or bladder habits.    ROS: As per HPI.  All other pertinent ROS negative.     Past Medical History:  Diagnosis Date  . Breast mass, right 10/03/2015  . Cyst of right breast 07/24/2015   Has 7 mm complex cyst on right, ?oill cyst follow up in 6 months at [facility]  . Hormone replacement therapy (HRT) 08/02/2014  . Hyperlipidemia   . Migraine    visual  . MS (multiple sclerosis) ([place])   . Thyroid disease    Past Surgical History:  Procedure Laterality Date  . BREAST SURGERY     breast augmentation  . DILATION AND CURETTAGE OF UTERUS     No Known Allergies No current facility-administered medications on file prior to encounter.   Current Outpatient Medications on File Prior to Encounter  Medication Sig Dispense Refill  . buPROPion (WELLBUTRIN XL) 300 MG 24 hr tablet Take 300 mg by mouth daily.    Marland Kitchen escitalopram (LEXAPRO) 20 MG tablet Take 20 mg by mouth daily.    Marland Kitchen estradiol (ESTRACE) 0.5 MG tablet TAKE 1 TABLET BY MOUTH EVERY DAY 30 tablet 12  . fexofenadine (ALLEGRA) 180 MG tablet Take 180 mg by mouth daily.    Marland Kitchen ibuprofen (ADVIL,MOTRIN) 200 MG tablet Take 400 mg by mouth as needed.    Marland Kitchen levothyroxine (SYNTHROID, LEVOTHROID) 75 MCG tablet Take 75 mcg by mouth daily before breakfast.    . progesterone (PROMETRIUM) 100 MG capsule TAKE 2 CAPSULES(200 MG) BY MOUTH DAILY 60 capsule 6  . Testosterone 10 MG/ACT (2%) GEL APPLY 2.95 ML 1 CLICK TO 2.84 ML 2 CLICKS DAILY BEHIND  THE KNEE. 30 g 5  . Testosterone Propionate POWD, Salt Stable LS Advanced CREA APPLY 1.32 ML (1 CLICK) TO 4.40 ML (2 CLICKS) DAILY BEHIND THE KNEE. 30 g 0   Social History   Socioeconomic History  . Marital status: Married    Spouse name: Not on file  . Number of children: Not on file  . Years of education: Not on file  . Highest education level: Not on file  Occupational History  . Not on file  Tobacco Use  . Smoking status: Never Smoker  . Smokeless tobacco: Never Used  Vaping Use  . Vaping Use: Never used  Substance and Sexual Activity  . Alcohol use: Yes    Comment: social  . Drug use: No  . Sexual activity: Yes    Birth control/protection: Post-menopausal  Other Topics Concern  . Not on file  Social History Narrative  . Not on file   Social Determinants of Health   Financial Resource Strain: Low Risk   . Difficulty of Paying Living Expenses: Not hard at all  Food Insecurity: No Food Insecurity  . Worried About Charity fundraiser in the Last Year: Never true  . Ran Out of Food in the Last Year: Never true  Transportation Needs: No Transportation Needs  . Lack of Transportation (  Medical): No  . Lack of Transportation (Non-Medical): No  Physical Activity: Insufficiently Active  . Days of Exercise per Week: 3 days  . Minutes of Exercise per Session: 30 min  Stress: No Stress Concern Present  . Feeling of Stress : Only a little  Social Connections: Socially Integrated  . Frequency of Communication with Friends and Family: More than three times a week  . Frequency of Social Gatherings with Friends and Family: Twice a week  . Attends Religious Services: 1 to 4 times per year  . Active Member of Clubs or Organizations: Yes  . Attends Archivist Meetings: Never  . Marital Status: Married  Human resources officer Violence: Not At Risk  . Fear of Current or Ex-Partner: No  . Emotionally Abused: No  . Physically Abused: No  . Sexually Abused: No   Family History   Problem Relation Age of Onset  . Hypertension Mother   . Other Mother        spinal stenosis  . Alzheimer's disease Father   . Cancer Father        prancreatic  . Hypertension Brother   . Hypertension Daughter   . Heart disease Maternal Grandmother   . Heart disease Maternal Grandfather   . Cancer Paternal Grandmother        ovarian and colon  . Cancer Paternal Grandfather        prostate    OBJECTIVE:  Vitals:   08/10/20 1017  BP: 98/63  Pulse: (!) 103  Resp: 20  Temp: (!) 97.4 F (36.3 C)  TempSrc: Oral  SpO2: 97%     General appearance: alert; appears fatigued, but nontoxic; speaking in full sentences and tolerating own secretions HEENT: NCAT; Ears: EACs clear, TMs pearly gray; Eyes: PERRL.  EOM grossly intact. Nose: nares patent some clear rhinorrhea, turbinates swollen and erythematous, Throat: oropharynx erythematous, tonsils absent, uvula midline  Neck: supple without LAD Lungs: unlabored respirations, symmetrical air entry; cough: mild; no respiratory distress; CTAB Heart: regular rate and rhythm.   Skin: warm and dry Psychological: alert and cooperative; normal mood and affect  ASSESSMENT & PLAN:  1. Sore throat   2. Cough   3. Sinus congestion     Meds ordered this encounter  Medications  . predniSONE (STERAPRED UNI-PAK 21 TAB) 10 MG (21) TBPK tablet    Sig: Take by mouth daily. Take 6 tabs by mouth daily  for 2 days, then 5 tabs for 2 days, then 4 tabs for 2 days, then 3 tabs for 2 days, 2 tabs for 2 days, then 1 tab by mouth daily for 2 days    Dispense:  42 tablet    Refill:  0    Order Specific Question:   Supervising Provider    Answer:   Raylene Everts [[id number]]  . lidocaine (XYLOCAINE) 2 % solution    Sig: Use as directed 15 mLs in the mouth or throat as needed for mouth pain (Do NOT exceed 8 doses in a 24 hour period).    Dispense:  100 mL    Refill:  0    Order Specific Question:   Supervising Provider    Answer:   Raylene Everts  [[id number]]    Declines covid test Get plenty of rest and push fluids Prednisone for congestion Viscous lidocaine prescribed.  This is an oral solution you can swish, gargle, and/or swallow as needed for symptomatic relief of sore throat.  Do not exceed 8 doses in a  24 hour period.  Do not use prior to eating, as this will numb your entire mouth.    Use OTC zyrtec for nasal congestion, runny nose, and/or sore throat Use OTC flonase for nasal congestion and runny nose Use medications daily for symptom relief Use OTC medications like ibuprofen or tylenol as needed fever or pain Call or go to the ED if you have any new or worsening symptoms such as fever, worsening cough, shortness of breath, chest tightness, chest pain, turning blue, changes in mental status, etc...   Reviewed expectations re: course of current medical issues. Questions answered. Outlined signs and symptoms indicating need for more acute intervention. Patient verbalized understanding. Left prior to AVS         Lestine Box, PA-C 08/10/20 1055

## 2020-08-29 ENCOUNTER — Other Ambulatory Visit: Payer: Self-pay

## 2020-08-29 ENCOUNTER — Ambulatory Visit
Admission: RE | Admit: 2020-08-29 | Discharge: 2020-08-29 | Disposition: A | Discharge status: Home / Self Care | Payer: Medicare Other | Source: Ambulatory Visit | Attending: Allergy and Immunology | Admitting: Allergy and Immunology

## 2020-08-29 ENCOUNTER — Other Ambulatory Visit: Payer: Self-pay | Admitting: Allergy and Immunology

## 2020-08-29 DIAGNOSIS — R059 Cough, unspecified: Secondary | ICD-10-CM

## 2020-08-29 DIAGNOSIS — J3089 Other allergic rhinitis: Secondary | ICD-10-CM | POA: Diagnosis not present

## 2020-08-29 DIAGNOSIS — J301 Allergic rhinitis due to pollen: Secondary | ICD-10-CM | POA: Diagnosis not present

## 2020-08-29 DIAGNOSIS — H1045 Other chronic allergic conjunctivitis: Secondary | ICD-10-CM | POA: Diagnosis not present

## 2020-09-12 DIAGNOSIS — K219 Gastro-esophageal reflux disease without esophagitis: Secondary | ICD-10-CM | POA: Diagnosis not present

## 2020-09-12 DIAGNOSIS — R059 Cough, unspecified: Secondary | ICD-10-CM | POA: Diagnosis not present

## 2020-09-12 DIAGNOSIS — K449 Diaphragmatic hernia without obstruction or gangrene: Secondary | ICD-10-CM | POA: Diagnosis not present

## 2020-09-12 DIAGNOSIS — J302 Other seasonal allergic rhinitis: Secondary | ICD-10-CM | POA: Diagnosis not present

## 2020-09-12 DIAGNOSIS — Z6824 Body mass index (BMI) 24.0-24.9, adult: Secondary | ICD-10-CM | POA: Diagnosis not present

## 2020-09-30 DIAGNOSIS — D2261 Melanocytic nevi of right upper limb, including shoulder: Secondary | ICD-10-CM | POA: Diagnosis not present

## 2020-09-30 DIAGNOSIS — L814 Other melanin hyperpigmentation: Secondary | ICD-10-CM | POA: Diagnosis not present

## 2020-09-30 DIAGNOSIS — D225 Melanocytic nevi of trunk: Secondary | ICD-10-CM | POA: Diagnosis not present

## 2020-09-30 DIAGNOSIS — C44519 Basal cell carcinoma of skin of other part of trunk: Secondary | ICD-10-CM | POA: Diagnosis not present

## 2020-09-30 DIAGNOSIS — L821 Other seborrheic keratosis: Secondary | ICD-10-CM | POA: Diagnosis not present

## 2020-09-30 DIAGNOSIS — D485 Neoplasm of uncertain behavior of skin: Secondary | ICD-10-CM | POA: Diagnosis not present

## 2020-09-30 DIAGNOSIS — L570 Actinic keratosis: Secondary | ICD-10-CM | POA: Diagnosis not present

## 2020-10-23 DIAGNOSIS — C44519 Basal cell carcinoma of skin of other part of trunk: Secondary | ICD-10-CM | POA: Diagnosis not present

## 2020-10-25 DIAGNOSIS — Z23 Encounter for immunization: Secondary | ICD-10-CM | POA: Diagnosis not present

## 2020-11-27 DIAGNOSIS — Z1389 Encounter for screening for other disorder: Secondary | ICD-10-CM | POA: Diagnosis not present

## 2020-11-27 DIAGNOSIS — G35 Multiple sclerosis: Secondary | ICD-10-CM | POA: Diagnosis not present

## 2020-11-27 DIAGNOSIS — Z23 Encounter for immunization: Secondary | ICD-10-CM | POA: Diagnosis not present

## 2020-11-27 DIAGNOSIS — F329 Major depressive disorder, single episode, unspecified: Secondary | ICD-10-CM | POA: Diagnosis not present

## 2020-12-23 DIAGNOSIS — H33321 Round hole, right eye: Secondary | ICD-10-CM | POA: Diagnosis not present

## 2020-12-23 DIAGNOSIS — H2513 Age-related nuclear cataract, bilateral: Secondary | ICD-10-CM | POA: Diagnosis not present

## 2020-12-23 DIAGNOSIS — H35371 Puckering of macula, right eye: Secondary | ICD-10-CM | POA: Diagnosis not present

## 2021-01-01 ENCOUNTER — Other Ambulatory Visit: Payer: Self-pay | Admitting: Obstetrics & Gynecology

## 2021-01-01 ENCOUNTER — Other Ambulatory Visit: Payer: Self-pay | Admitting: Adult Health

## 2021-01-02 ENCOUNTER — Ambulatory Visit (INDEPENDENT_AMBULATORY_CARE_PROVIDER_SITE_OTHER): Payer: Medicare Other | Admitting: Adult Health

## 2021-01-02 ENCOUNTER — Other Ambulatory Visit: Payer: Self-pay

## 2021-01-02 ENCOUNTER — Encounter: Payer: Self-pay | Admitting: Adult Health

## 2021-01-02 VITALS — BP 128/81 | HR 85 | Ht 62.0 in | Wt 134.0 lb

## 2021-01-02 DIAGNOSIS — Z1211 Encounter for screening for malignant neoplasm of colon: Secondary | ICD-10-CM

## 2021-01-02 DIAGNOSIS — G35 Multiple sclerosis: Secondary | ICD-10-CM

## 2021-01-02 DIAGNOSIS — Z01419 Encounter for gynecological examination (general) (routine) without abnormal findings: Secondary | ICD-10-CM | POA: Diagnosis not present

## 2021-01-02 DIAGNOSIS — Z79890 Hormone replacement therapy (postmenopausal): Secondary | ICD-10-CM

## 2021-01-02 LAB — HEMOCCULT GUIAC POC 1CARD (OFFICE): Fecal Occult Blood, POC: NEGATIVE

## 2021-01-02 NOTE — Progress Notes (Signed)
Patient ID: [name], female   DOB: [birth year], 65 y.o.   MRN: [id number] History of Present Illness: [name] is a 65 year old white female, married,PM, in for a well woman gyn exam.Doing well.  Lab Results  Component Value Date   DIAGPAP  09/05/2019    - Negative for intraepithelial lesion or malignancy (NILM)   [place] Negative 09/05/2019   PCP Dr Hilma Favors   Current Medications, Allergies, Past Medical History, Past Surgical History, Family History and Social History were reviewed in [facility] record.     Review of Systems:  Patient denies any headaches, hearing loss, fatigue, blurred vision, shortness of breath, chest pain, abdominal pain, problems with bowel movements, urination, or intercourse. No joint pain or mood swings.  Denies an vaginal bleeding.    Physical Exam:BP 128/81 (BP Location: Left Arm, Patient Position: Sitting, Cuff Size: Normal)   Pulse 85   Ht '5\' 2"'$  (1.575 m)   Wt 134 lb (60.8 kg)   BMI 24.51 kg/m   General:  Well developed, well nourished, no acute distress Skin:  Warm and dry Neck:  Midline trachea, normal thyroid, good ROM, no lymphadenopathy,no carotid bruits heard  Lungs; Clear to auscultation bilaterally Breast:  No dominant palpable mass, retraction, or nipple discharge, has bilateral implants Cardiovascular: Regular rate and rhythm Abdomen:  Soft, non tender, no hepatosplenomegaly Pelvic:  External genitalia is normal in appearance, no lesions.  The vagina is normal in appearance,has good moisture. Urethra has no lesions or masses. The cervix is smooth.  Uterus is felt to be normal size, shape, and contour.  No adnexal masses or tenderness noted.Bladder is non tender, no masses felt. Rectal: Good sphincter tone, no polyps, or hemorrhoids felt.  Hemoccult negative. Extremities/musculoskeletal:  No swelling or varicosities noted, no clubbing or cyanosis Psych:  No mood changes, alert and cooperative,seems happy AA is  2 Fall risk is low Depression screen [facility] 2/9 01/02/2021 09/05/2019 08/24/2017  Decreased Interest 0 0 0  Down, Depressed, Hopeless 0 0 0  PHQ - 2 Score 0 0 0  Altered sleeping 0 0 -  Tired, decreased energy 1 1 -  Change in appetite 0 0 -  Feeling bad or failure about yourself  0 0 -  Trouble concentrating 0 0 -  Moving slowly or fidgety/restless 0 0 -  Suicidal thoughts 0 0 -  PHQ-9 Score 1 1 -  Difficult doing work/chores - Not difficult at all -    GAD 7 : Generalized Anxiety Score 01/02/2021 09/05/2019  Nervous, Anxious, on Edge 0 0  Control/stop worrying 0 0  Worry too much - different things 0 0  Trouble relaxing 0 0  Restless 0 0  Easily annoyed or irritable 0 0  Afraid - awful might happen 0 0  Total GAD 7 Score 0 0  Anxiety Difficulty - Not difficult at all      Upstream - 01/02/21 1345       Pregnancy Intention Screening   Does the patient want to become pregnant in the next year? No    Does the patient's partner want to become pregnant in the next year? No    Would the patient like to discuss contraceptive options today? No      Contraception Wrap Up   Current Method No Method - Other Reason   postmenopausal   End Method No Method - Other Reason   postmenopausal   Contraception Counseling Provided No  Examination chaperoned by Levy Pupa LPN.  Impression and Plan: 1. Encounter for well woman exam with routine gynecological exam Physical in 1 year Pap 2024 Mammogram yearly Colonoscopy per GI Has had labs  2. Hormone replacement therapy (HRT) Continue Estrace, Prometrium and testosterone, has refills   3. Encounter for screening fecal occult blood testing   4. MS (multiple sclerosis) ([place])

## 2021-02-05 DIAGNOSIS — Z85828 Personal history of other malignant neoplasm of skin: Secondary | ICD-10-CM | POA: Diagnosis not present

## 2021-02-05 DIAGNOSIS — L821 Other seborrheic keratosis: Secondary | ICD-10-CM | POA: Diagnosis not present

## 2021-02-05 DIAGNOSIS — L910 Hypertrophic scar: Secondary | ICD-10-CM | POA: Diagnosis not present

## 2021-02-05 DIAGNOSIS — L570 Actinic keratosis: Secondary | ICD-10-CM | POA: Diagnosis not present

## 2021-02-10 DIAGNOSIS — Z20828 Contact with and (suspected) exposure to other viral communicable diseases: Secondary | ICD-10-CM | POA: Diagnosis not present

## 2021-02-10 DIAGNOSIS — U071 COVID-19: Secondary | ICD-10-CM | POA: Diagnosis not present

## 2021-03-13 DIAGNOSIS — Z23 Encounter for immunization: Secondary | ICD-10-CM | POA: Diagnosis not present

## 2021-03-17 DIAGNOSIS — G35 Multiple sclerosis: Secondary | ICD-10-CM | POA: Diagnosis not present

## 2021-03-17 DIAGNOSIS — E039 Hypothyroidism, unspecified: Secondary | ICD-10-CM | POA: Diagnosis not present

## 2021-03-17 DIAGNOSIS — F329 Major depressive disorder, single episode, unspecified: Secondary | ICD-10-CM | POA: Diagnosis not present

## 2021-05-05 ENCOUNTER — Other Ambulatory Visit: Payer: Self-pay | Admitting: Adult Health

## 2021-05-05 ENCOUNTER — Telehealth: Payer: Self-pay | Admitting: Adult Health

## 2021-05-05 ENCOUNTER — Other Ambulatory Visit: Payer: Self-pay

## 2021-05-05 NOTE — Telephone Encounter (Signed)
[name] wants testosterone refilled at [facility]

## 2021-05-08 DIAGNOSIS — Z23 Encounter for immunization: Secondary | ICD-10-CM | POA: Diagnosis not present

## 2021-07-28 DIAGNOSIS — E039 Hypothyroidism, unspecified: Secondary | ICD-10-CM | POA: Diagnosis not present

## 2021-07-28 DIAGNOSIS — Z6825 Body mass index (BMI) 25.0-25.9, adult: Secondary | ICD-10-CM | POA: Diagnosis not present

## 2021-07-28 DIAGNOSIS — G35 Multiple sclerosis: Secondary | ICD-10-CM | POA: Diagnosis not present

## 2021-07-28 DIAGNOSIS — K219 Gastro-esophageal reflux disease without esophagitis: Secondary | ICD-10-CM | POA: Diagnosis not present

## 2021-08-01 ENCOUNTER — Other Ambulatory Visit: Payer: Self-pay | Admitting: Adult Health

## 2021-08-04 DIAGNOSIS — Z1231 Encounter for screening mammogram for malignant neoplasm of breast: Secondary | ICD-10-CM | POA: Diagnosis not present

## 2021-08-12 DIAGNOSIS — N6314 Unspecified lump in the right breast, lower inner quadrant: Secondary | ICD-10-CM | POA: Diagnosis not present

## 2021-08-12 DIAGNOSIS — R922 Inconclusive mammogram: Secondary | ICD-10-CM | POA: Diagnosis not present

## 2021-08-23 DIAGNOSIS — C50919 Malignant neoplasm of unspecified site of unspecified female breast: Secondary | ICD-10-CM

## 2021-08-23 HISTORY — DX: Malignant neoplasm of unspecified site of unspecified female breast: C50.919

## 2021-08-26 ENCOUNTER — Other Ambulatory Visit: Payer: Self-pay | Admitting: Radiology

## 2021-08-26 DIAGNOSIS — N6001 Solitary cyst of right breast: Secondary | ICD-10-CM | POA: Diagnosis not present

## 2021-08-26 DIAGNOSIS — D0511 Intraductal carcinoma in situ of right breast: Secondary | ICD-10-CM | POA: Diagnosis not present

## 2021-08-27 ENCOUNTER — Other Ambulatory Visit: Payer: Medicare Other | Admitting: Adult Health

## 2021-08-27 ENCOUNTER — Encounter: Payer: Self-pay | Admitting: Adult Health

## 2021-08-28 ENCOUNTER — Telehealth: Payer: Self-pay | Admitting: Hematology and Oncology

## 2021-08-28 NOTE — Telephone Encounter (Signed)
Spoke to patient to confirm morning clinic appointment for 4/19, [name] will send packet ?

## 2021-09-02 ENCOUNTER — Encounter: Payer: Self-pay | Admitting: *Deleted

## 2021-09-02 DIAGNOSIS — D0511 Intraductal carcinoma in situ of right breast: Secondary | ICD-10-CM | POA: Insufficient documentation

## 2021-09-03 DIAGNOSIS — Z803 Family history of malignant neoplasm of breast: Secondary | ICD-10-CM | POA: Diagnosis not present

## 2021-09-03 DIAGNOSIS — C50919 Malignant neoplasm of unspecified site of unspecified female breast: Secondary | ICD-10-CM | POA: Diagnosis not present

## 2021-09-04 DIAGNOSIS — Z170 Estrogen receptor positive status [ER+]: Secondary | ICD-10-CM | POA: Diagnosis not present

## 2021-09-04 DIAGNOSIS — N6001 Solitary cyst of right breast: Secondary | ICD-10-CM | POA: Diagnosis not present

## 2021-09-04 DIAGNOSIS — N641 Fat necrosis of breast: Secondary | ICD-10-CM | POA: Diagnosis not present

## 2021-09-04 DIAGNOSIS — D0511 Intraductal carcinoma in situ of right breast: Secondary | ICD-10-CM | POA: Diagnosis not present

## 2021-09-05 ENCOUNTER — Encounter: Payer: Self-pay | Admitting: *Deleted

## 2021-09-10 ENCOUNTER — Ambulatory Visit: Payer: Medicare Other | Admitting: Radiation Oncology

## 2021-09-10 ENCOUNTER — Ambulatory Visit: Payer: Medicare Other | Admitting: Hematology and Oncology

## 2021-09-10 ENCOUNTER — Ambulatory Visit: Payer: Medicare Other | Admitting: Physical Therapy

## 2021-09-10 ENCOUNTER — Other Ambulatory Visit: Payer: Medicare Other

## 2021-09-17 DIAGNOSIS — J301 Allergic rhinitis due to pollen: Secondary | ICD-10-CM | POA: Diagnosis not present

## 2021-09-17 DIAGNOSIS — R052 Subacute cough: Secondary | ICD-10-CM | POA: Diagnosis not present

## 2021-09-17 DIAGNOSIS — H1045 Other chronic allergic conjunctivitis: Secondary | ICD-10-CM | POA: Diagnosis not present

## 2021-09-17 DIAGNOSIS — J3089 Other allergic rhinitis: Secondary | ICD-10-CM | POA: Diagnosis not present

## 2021-09-22 HISTORY — PX: BREAST SURGERY: SHX581

## 2021-10-07 ENCOUNTER — Other Ambulatory Visit: Payer: Medicare Other | Admitting: Adult Health

## 2021-10-13 DIAGNOSIS — D0511 Intraductal carcinoma in situ of right breast: Secondary | ICD-10-CM | POA: Diagnosis not present

## 2021-10-15 DIAGNOSIS — Z170 Estrogen receptor positive status [ER+]: Secondary | ICD-10-CM | POA: Diagnosis not present

## 2021-10-15 DIAGNOSIS — Z79890 Hormone replacement therapy (postmenopausal): Secondary | ICD-10-CM | POA: Diagnosis not present

## 2021-10-15 DIAGNOSIS — Z79899 Other long term (current) drug therapy: Secondary | ICD-10-CM | POA: Diagnosis not present

## 2021-10-15 DIAGNOSIS — D0511 Intraductal carcinoma in situ of right breast: Secondary | ICD-10-CM | POA: Diagnosis not present

## 2021-10-15 DIAGNOSIS — J309 Allergic rhinitis, unspecified: Secondary | ICD-10-CM | POA: Diagnosis not present

## 2021-10-15 DIAGNOSIS — E039 Hypothyroidism, unspecified: Secondary | ICD-10-CM | POA: Diagnosis not present

## 2021-10-17 DIAGNOSIS — R920 Mammographic microcalcification found on diagnostic imaging of breast: Secondary | ICD-10-CM | POA: Diagnosis not present

## 2021-10-17 DIAGNOSIS — D0511 Intraductal carcinoma in situ of right breast: Secondary | ICD-10-CM | POA: Diagnosis not present

## 2021-10-17 DIAGNOSIS — N641 Fat necrosis of breast: Secondary | ICD-10-CM | POA: Diagnosis not present

## 2021-10-17 DIAGNOSIS — Z170 Estrogen receptor positive status [ER+]: Secondary | ICD-10-CM | POA: Diagnosis not present

## 2021-10-17 DIAGNOSIS — N6001 Solitary cyst of right breast: Secondary | ICD-10-CM | POA: Diagnosis not present

## 2021-11-12 ENCOUNTER — Other Ambulatory Visit: Payer: Medicare Other | Admitting: Adult Health

## 2021-11-13 DIAGNOSIS — C50311 Malignant neoplasm of lower-inner quadrant of right female breast: Secondary | ICD-10-CM | POA: Diagnosis not present

## 2022-01-05 ENCOUNTER — Ambulatory Visit (INDEPENDENT_AMBULATORY_CARE_PROVIDER_SITE_OTHER): Payer: Medicare Other | Admitting: Adult Health

## 2022-01-05 ENCOUNTER — Encounter: Payer: Self-pay | Admitting: Adult Health

## 2022-01-05 VITALS — BP 154/93 | HR 76 | Ht 62.25 in | Wt 133.0 lb

## 2022-01-05 DIAGNOSIS — G35D Multiple sclerosis, unspecified: Secondary | ICD-10-CM

## 2022-01-05 DIAGNOSIS — G35 Multiple sclerosis: Secondary | ICD-10-CM | POA: Diagnosis not present

## 2022-01-05 DIAGNOSIS — R030 Elevated blood-pressure reading, without diagnosis of hypertension: Secondary | ICD-10-CM | POA: Diagnosis not present

## 2022-01-05 DIAGNOSIS — Z1211 Encounter for screening for malignant neoplasm of colon: Secondary | ICD-10-CM

## 2022-01-05 DIAGNOSIS — Z01419 Encounter for gynecological examination (general) (routine) without abnormal findings: Secondary | ICD-10-CM

## 2022-01-05 DIAGNOSIS — Z853 Personal history of malignant neoplasm of breast: Secondary | ICD-10-CM

## 2022-01-05 LAB — HEMOCCULT GUIAC POC 1CARD (OFFICE): Fecal Occult Blood, POC: NEGATIVE

## 2022-01-05 NOTE — Progress Notes (Signed)
Patient ID: [name], female   DOB: 05/10/1956, 66 y.o.   MRN: [id number] History of Present Illness: [name] is a 66 year old white female,married, PM in for a well woman gyn exam. She was diagnosed with breast cancer this year and is going to have a mastectomy this Friday, and implants replaced.  No radiation or chemo. She stopped HRT.  She had a new grandson too.  Lab Results  Component Value Date   DIAGPAP  09/05/2019    - Negative for intraepithelial lesion or malignancy (NILM)   [place] Negative 09/05/2019    PCP is Dr Hilma Favors  Current Medications, Allergies, Past Medical History, Past Surgical History, Family History and Social History were reviewed in [facility] record.     Review of Systems: Patient denies any headaches, hearing loss,  blurred vision, shortness of breath, chest pain, abdominal pain, problems with bowel movements, urination, or intercourse. No joint pain or mood swings. Has decreased energy. Denies any vaginal bleeding    Physical Exam:BP (!) 154/93 (BP Location: Left Arm, Patient Position: Sitting, Cuff Size: Normal)   Pulse 76   Ht 5' 2.25" (1.581 m)   Wt 133 lb (60.3 kg)   BMI 24.13 kg/m   General:  Well developed, well nourished, no acute distress Skin:  Warm and dry Neck:  Midline trachea, normal thyroid, good ROM, no lymphadenopathy,no carotid bruits heard Lungs; Clear to auscultation bilaterally Breast:  No dominant palpable mass, retraction, or nipple discharge, has deflated implant on right was nicked when had lumpectomy and has implant on the left.  Cardiovascular: Regular rate and rhythm Abdomen:  Soft, non tender, no hepatosplenomegaly Pelvic:  External genitalia is normal in appearance, no lesions.  The vagina is pale with loss of rugae. Urethra has no lesions or masses. The cervix is bulbous.  Uterus is felt to be normal size, shape, and contour.  No adnexal masses or tenderness noted.Bladder is non tender, no  masses felt. Rectal: Good sphincter tone, no polyps, or hemorrhoids felt.  Hemoccult negative. Extremities/musculoskeletal:  No swelling or varicosities noted, no clubbing or cyanosis Psych:  No mood changes, alert and cooperative,seems happy AA is 1 Fall risk is low    01/05/2022   10:02 AM 01/02/2021    1:47 PM 09/05/2019   10:31 AM  Depression screen PHQ 2/9  Decreased Interest 0 0 0  Down, Depressed, Hopeless 1 0 0  PHQ - 2 Score 1 0 0  Altered sleeping 0 0 0  Tired, decreased energy '1 1 1  '$ Change in appetite 0 0 0  Feeling bad or failure about yourself  0 0 0  Trouble concentrating 0 0 0  Moving slowly or fidgety/restless 0 0 0  Suicidal thoughts 0 0 0  PHQ-9 Score '2 1 1  '$ Difficult doing work/chores   Not difficult at all       01/05/2022   10:02 AM 01/02/2021    1:47 PM 09/05/2019   10:31 AM  GAD 7 : Generalized Anxiety Score  Nervous, Anxious, on Edge 1 0 0  Control/stop worrying 1 0 0  Worry too much - different things 0 0 0  Trouble relaxing 1 0 0  Restless 0 0 0  Easily annoyed or irritable 0 0 0  Afraid - awful might happen 1 0 0  Total GAD 7 Score 4 0 0  Anxiety Difficulty   Not difficult at all    Upstream - 01/05/22 1008  Pregnancy Intention Screening   Does the patient want to become pregnant in the next year? No    Does the patient's partner want to become pregnant in the next year? No    Would the patient like to discuss contraceptive options today? No      Contraception Wrap Up   Current Method No Method - Other Reason   postmenopausal   End Method No Method - Other Reason   postmenopausal   Contraception Counseling Provided No            Examination chaperoned by Levy Pupa LPN    Impression and Plan: 1. Encounter for well woman exam with routine gynecological exam Physical in 2 years Labs with PCP Colonoscopy per GI Mammogram yearly   2. Encounter for screening fecal occult blood testing Hemoccult is negative  - POCT occult  blood stool  3. MS (multiple sclerosis) ([place])  4. History of breast cancer For mastectomy Friday   5. Elevated BP without diagnosis of hypertension Keep check on BP  Has appt Thursday with plastic surgeon

## 2022-01-09 DIAGNOSIS — D0510 Intraductal carcinoma in situ of unspecified breast: Secondary | ICD-10-CM | POA: Diagnosis not present

## 2022-01-09 DIAGNOSIS — Z79899 Other long term (current) drug therapy: Secondary | ICD-10-CM | POA: Diagnosis not present

## 2022-01-09 DIAGNOSIS — Z9882 Breast implant status: Secondary | ICD-10-CM | POA: Diagnosis not present

## 2022-01-09 DIAGNOSIS — F32A Depression, unspecified: Secondary | ICD-10-CM | POA: Diagnosis not present

## 2022-01-09 DIAGNOSIS — R920 Mammographic microcalcification found on diagnostic imaging of breast: Secondary | ICD-10-CM | POA: Diagnosis not present

## 2022-01-09 DIAGNOSIS — C50311 Malignant neoplasm of lower-inner quadrant of right female breast: Secondary | ICD-10-CM | POA: Diagnosis not present

## 2022-01-09 DIAGNOSIS — Z170 Estrogen receptor positive status [ER+]: Secondary | ICD-10-CM | POA: Diagnosis not present

## 2022-01-09 DIAGNOSIS — N6091 Unspecified benign mammary dysplasia of right breast: Secondary | ICD-10-CM | POA: Diagnosis not present

## 2022-01-09 DIAGNOSIS — D0511 Intraductal carcinoma in situ of right breast: Secondary | ICD-10-CM | POA: Diagnosis not present

## 2022-01-09 DIAGNOSIS — N651 Disproportion of reconstructed breast: Secondary | ICD-10-CM | POA: Diagnosis not present

## 2022-01-09 DIAGNOSIS — F419 Anxiety disorder, unspecified: Secondary | ICD-10-CM | POA: Diagnosis not present

## 2022-01-09 DIAGNOSIS — Z9011 Acquired absence of right breast and nipple: Secondary | ICD-10-CM | POA: Diagnosis not present

## 2022-01-09 DIAGNOSIS — Z800 Family history of malignant neoplasm of digestive organs: Secondary | ICD-10-CM | POA: Diagnosis not present

## 2022-01-09 DIAGNOSIS — N6021 Fibroadenosis of right breast: Secondary | ICD-10-CM | POA: Diagnosis not present

## 2022-01-12 DIAGNOSIS — N6081 Other benign mammary dysplasias of right breast: Secondary | ICD-10-CM | POA: Diagnosis not present

## 2022-01-12 DIAGNOSIS — R920 Mammographic microcalcification found on diagnostic imaging of breast: Secondary | ICD-10-CM | POA: Diagnosis not present

## 2022-01-12 DIAGNOSIS — N6021 Fibroadenosis of right breast: Secondary | ICD-10-CM | POA: Diagnosis not present

## 2022-01-12 DIAGNOSIS — N641 Fat necrosis of breast: Secondary | ICD-10-CM | POA: Diagnosis not present

## 2022-01-12 DIAGNOSIS — N6011 Diffuse cystic mastopathy of right breast: Secondary | ICD-10-CM | POA: Diagnosis not present

## 2022-03-02 DIAGNOSIS — L814 Other melanin hyperpigmentation: Secondary | ICD-10-CM | POA: Diagnosis not present

## 2022-03-02 DIAGNOSIS — L821 Other seborrheic keratosis: Secondary | ICD-10-CM | POA: Diagnosis not present

## 2022-03-02 DIAGNOSIS — Z85828 Personal history of other malignant neoplasm of skin: Secondary | ICD-10-CM | POA: Diagnosis not present

## 2022-03-02 DIAGNOSIS — D1801 Hemangioma of skin and subcutaneous tissue: Secondary | ICD-10-CM | POA: Diagnosis not present

## 2022-03-02 DIAGNOSIS — D225 Melanocytic nevi of trunk: Secondary | ICD-10-CM | POA: Diagnosis not present

## 2022-03-02 DIAGNOSIS — D2261 Melanocytic nevi of right upper limb, including shoulder: Secondary | ICD-10-CM | POA: Diagnosis not present

## 2022-04-07 DIAGNOSIS — Z1331 Encounter for screening for depression: Secondary | ICD-10-CM | POA: Diagnosis not present

## 2022-04-07 DIAGNOSIS — E039 Hypothyroidism, unspecified: Secondary | ICD-10-CM | POA: Diagnosis not present

## 2022-04-07 DIAGNOSIS — Z0001 Encounter for general adult medical examination with abnormal findings: Secondary | ICD-10-CM | POA: Diagnosis not present

## 2022-04-07 DIAGNOSIS — J302 Other seasonal allergic rhinitis: Secondary | ICD-10-CM | POA: Diagnosis not present

## 2022-04-07 DIAGNOSIS — F329 Major depressive disorder, single episode, unspecified: Secondary | ICD-10-CM | POA: Diagnosis not present

## 2022-04-07 DIAGNOSIS — C50911 Malignant neoplasm of unspecified site of right female breast: Secondary | ICD-10-CM | POA: Diagnosis not present

## 2022-04-07 DIAGNOSIS — E559 Vitamin D deficiency, unspecified: Secondary | ICD-10-CM | POA: Diagnosis not present

## 2022-04-07 DIAGNOSIS — E7849 Other hyperlipidemia: Secondary | ICD-10-CM | POA: Diagnosis not present

## 2022-04-07 DIAGNOSIS — K219 Gastro-esophageal reflux disease without esophagitis: Secondary | ICD-10-CM | POA: Diagnosis not present

## 2022-04-07 DIAGNOSIS — G35 Multiple sclerosis: Secondary | ICD-10-CM | POA: Diagnosis not present

## 2022-04-07 DIAGNOSIS — E782 Mixed hyperlipidemia: Secondary | ICD-10-CM | POA: Diagnosis not present

## 2022-04-07 DIAGNOSIS — Z23 Encounter for immunization: Secondary | ICD-10-CM | POA: Diagnosis not present

## 2022-04-07 DIAGNOSIS — Z6823 Body mass index (BMI) 23.0-23.9, adult: Secondary | ICD-10-CM | POA: Diagnosis not present

## 2022-06-01 DIAGNOSIS — H35371 Puckering of macula, right eye: Secondary | ICD-10-CM | POA: Diagnosis not present

## 2022-06-01 DIAGNOSIS — H33311 Horseshoe tear of retina without detachment, right eye: Secondary | ICD-10-CM | POA: Diagnosis not present

## 2022-06-01 DIAGNOSIS — H2513 Age-related nuclear cataract, bilateral: Secondary | ICD-10-CM | POA: Diagnosis not present

## 2022-06-08 ENCOUNTER — Telehealth: Payer: Self-pay

## 2022-06-08 NOTE — Telephone Encounter (Signed)
Pt called and wants mammogram, I called [facility] and left message with Isa Rankin,

## 2022-06-08 NOTE — Telephone Encounter (Signed)
Patient called and stated that she is trying to schedule a mammogram and wants to know if she needs to schedule a diagnotic or a regular mammogram.  Patient wanted to speak to [facility]

## 2022-06-08 NOTE — Telephone Encounter (Signed)
Pt has hx of breast ca. Wanted to discuss what type of mammogram she needs and when?

## 2022-07-27 DIAGNOSIS — J3489 Other specified disorders of nose and nasal sinuses: Secondary | ICD-10-CM | POA: Diagnosis not present

## 2022-07-27 DIAGNOSIS — J342 Deviated nasal septum: Secondary | ICD-10-CM | POA: Diagnosis not present

## 2022-07-27 DIAGNOSIS — M950 Acquired deformity of nose: Secondary | ICD-10-CM | POA: Diagnosis not present

## 2022-07-27 DIAGNOSIS — J343 Hypertrophy of nasal turbinates: Secondary | ICD-10-CM | POA: Diagnosis not present

## 2022-08-06 DIAGNOSIS — Z1231 Encounter for screening mammogram for malignant neoplasm of breast: Secondary | ICD-10-CM | POA: Diagnosis not present

## 2022-09-03 DIAGNOSIS — J343 Hypertrophy of nasal turbinates: Secondary | ICD-10-CM | POA: Diagnosis not present

## 2022-09-03 DIAGNOSIS — J342 Deviated nasal septum: Secondary | ICD-10-CM | POA: Diagnosis not present

## 2022-09-03 DIAGNOSIS — J3489 Other specified disorders of nose and nasal sinuses: Secondary | ICD-10-CM | POA: Diagnosis not present

## 2022-09-03 DIAGNOSIS — M950 Acquired deformity of nose: Secondary | ICD-10-CM | POA: Diagnosis not present

## 2022-09-15 DIAGNOSIS — H43393 Other vitreous opacities, bilateral: Secondary | ICD-10-CM | POA: Diagnosis not present

## 2022-09-18 DIAGNOSIS — J3089 Other allergic rhinitis: Secondary | ICD-10-CM | POA: Diagnosis not present

## 2022-09-18 DIAGNOSIS — R059 Cough, unspecified: Secondary | ICD-10-CM | POA: Diagnosis not present

## 2022-09-18 DIAGNOSIS — R052 Subacute cough: Secondary | ICD-10-CM | POA: Diagnosis not present

## 2022-09-18 DIAGNOSIS — J301 Allergic rhinitis due to pollen: Secondary | ICD-10-CM | POA: Diagnosis not present

## 2022-09-18 DIAGNOSIS — H1045 Other chronic allergic conjunctivitis: Secondary | ICD-10-CM | POA: Diagnosis not present

## 2023-01-05 DIAGNOSIS — J342 Deviated nasal septum: Secondary | ICD-10-CM | POA: Diagnosis not present

## 2023-01-05 DIAGNOSIS — J3489 Other specified disorders of nose and nasal sinuses: Secondary | ICD-10-CM | POA: Diagnosis not present

## 2023-01-05 DIAGNOSIS — J343 Hypertrophy of nasal turbinates: Secondary | ICD-10-CM | POA: Diagnosis not present

## 2023-01-05 DIAGNOSIS — M950 Acquired deformity of nose: Secondary | ICD-10-CM | POA: Diagnosis not present

## 2023-01-14 DIAGNOSIS — L03211 Cellulitis of face: Secondary | ICD-10-CM | POA: Diagnosis not present

## 2023-01-19 DIAGNOSIS — Z1231 Encounter for screening mammogram for malignant neoplasm of breast: Secondary | ICD-10-CM | POA: Diagnosis not present

## 2023-01-19 DIAGNOSIS — C50311 Malignant neoplasm of lower-inner quadrant of right female breast: Secondary | ICD-10-CM | POA: Diagnosis not present

## 2023-01-19 DIAGNOSIS — Z170 Estrogen receptor positive status [ER+]: Secondary | ICD-10-CM | POA: Diagnosis not present

## 2023-01-20 DIAGNOSIS — J340 Abscess, furuncle and carbuncle of nose: Secondary | ICD-10-CM | POA: Diagnosis not present

## 2023-02-11 IMAGING — CR DG CHEST 2V
2 series · 2 of 2 positions shown · non-contrast
Comparison: None.

CLINICAL DATA: Productive cough for 3 weeks.

EXAM:
CHEST - 2 VIEW

[w chest pa]
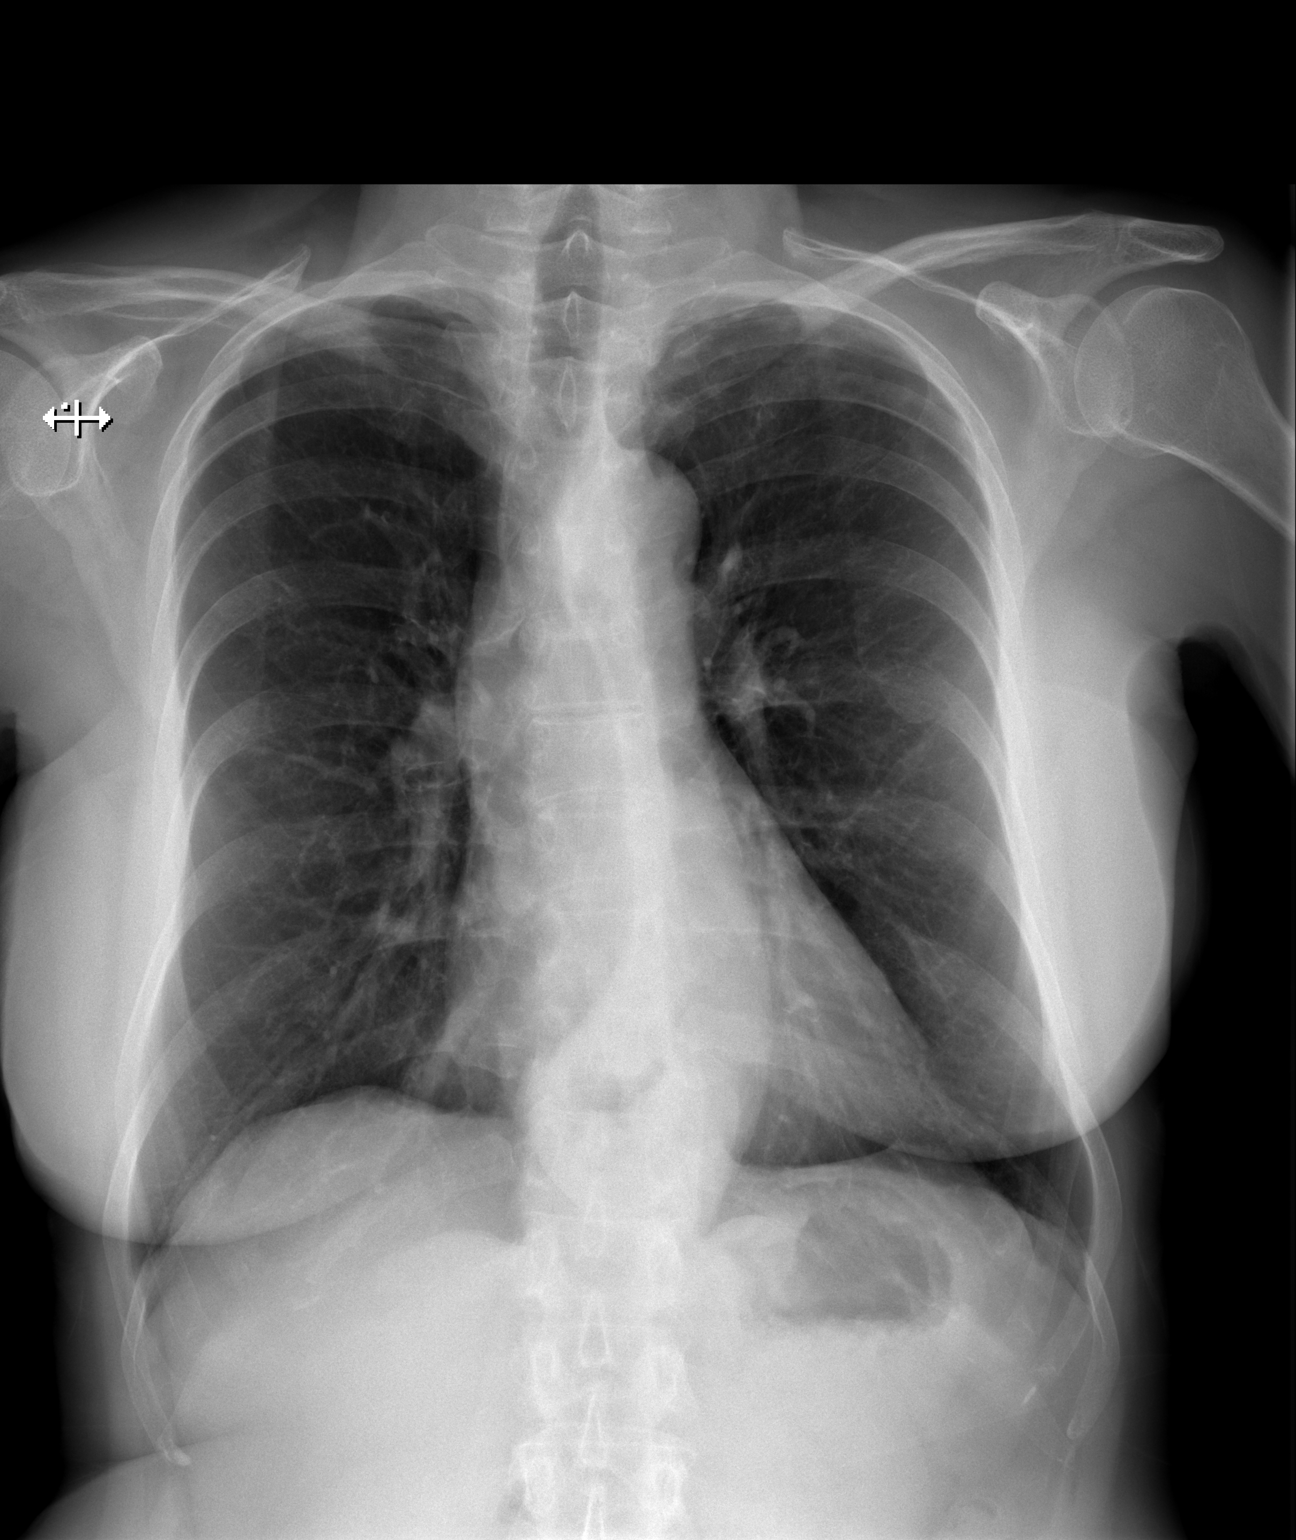

[w chest lat]
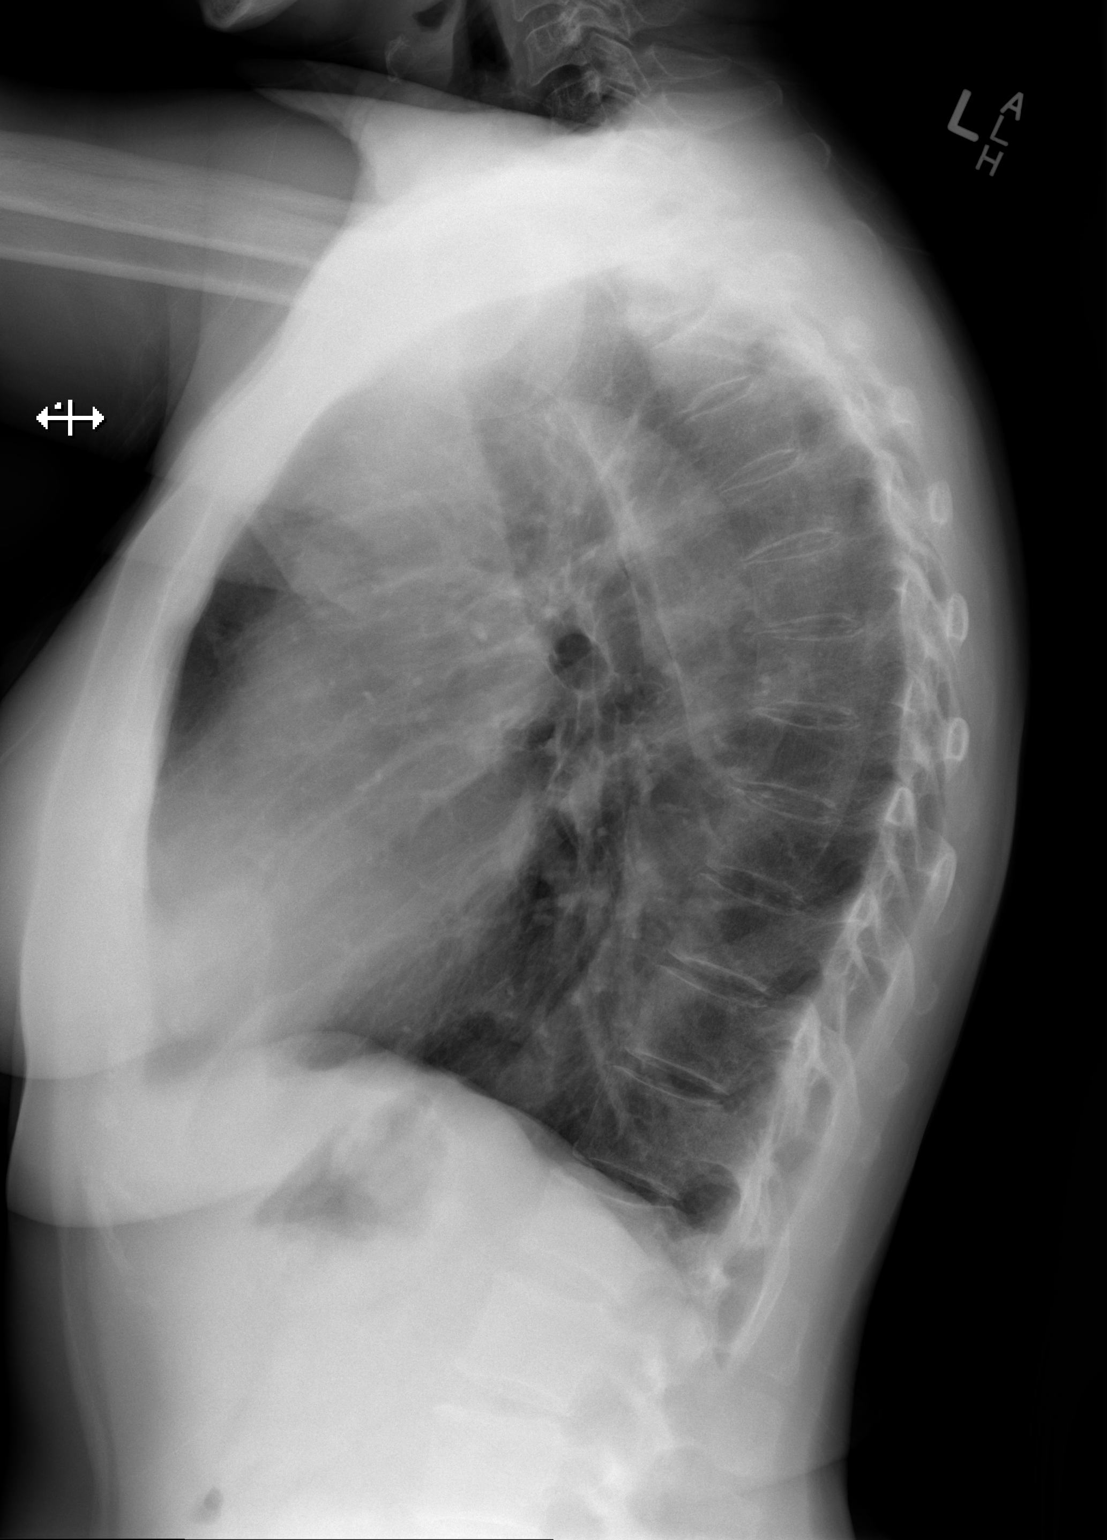

[2 of 2 positions shown; findings below may reference images not displayed]

FINDINGS: Small hiatal hernia. The heart, hila, mediastinum are unremarkable.
No pneumothorax. No nodules or masses. No focal infiltrates. No
acute abnormalities.
IMPRESSION: No cause for the patient's cough identified.  Small hiatal hernia.

## 2023-02-12 DIAGNOSIS — Z23 Encounter for immunization: Secondary | ICD-10-CM | POA: Diagnosis not present

## 2023-02-12 DIAGNOSIS — Z6823 Body mass index (BMI) 23.0-23.9, adult: Secondary | ICD-10-CM | POA: Diagnosis not present

## 2023-02-12 DIAGNOSIS — F329 Major depressive disorder, single episode, unspecified: Secondary | ICD-10-CM | POA: Diagnosis not present

## 2023-02-12 DIAGNOSIS — G35 Multiple sclerosis: Secondary | ICD-10-CM | POA: Diagnosis not present

## 2023-02-12 DIAGNOSIS — E039 Hypothyroidism, unspecified: Secondary | ICD-10-CM | POA: Diagnosis not present

## 2023-03-19 DIAGNOSIS — Z853 Personal history of malignant neoplasm of breast: Secondary | ICD-10-CM | POA: Diagnosis not present

## 2023-03-19 DIAGNOSIS — C50911 Malignant neoplasm of unspecified site of right female breast: Secondary | ICD-10-CM | POA: Diagnosis not present

## 2023-03-19 DIAGNOSIS — Z9011 Acquired absence of right breast and nipple: Secondary | ICD-10-CM | POA: Diagnosis not present

## 2023-03-19 DIAGNOSIS — N6489 Other specified disorders of breast: Secondary | ICD-10-CM | POA: Diagnosis not present

## 2023-03-19 DIAGNOSIS — Z79899 Other long term (current) drug therapy: Secondary | ICD-10-CM | POA: Diagnosis not present

## 2023-03-19 DIAGNOSIS — N650 Deformity of reconstructed breast: Secondary | ICD-10-CM | POA: Diagnosis not present

## 2023-03-19 DIAGNOSIS — F418 Other specified anxiety disorders: Secondary | ICD-10-CM | POA: Diagnosis not present

## 2023-03-19 DIAGNOSIS — F32A Depression, unspecified: Secondary | ICD-10-CM | POA: Diagnosis not present

## 2023-03-19 DIAGNOSIS — F419 Anxiety disorder, unspecified: Secondary | ICD-10-CM | POA: Diagnosis not present

## 2023-04-08 DIAGNOSIS — Z48813 Encounter for surgical aftercare following surgery on the respiratory system: Secondary | ICD-10-CM | POA: Diagnosis not present

## 2023-04-19 DIAGNOSIS — D225 Melanocytic nevi of trunk: Secondary | ICD-10-CM | POA: Diagnosis not present

## 2023-04-19 DIAGNOSIS — L814 Other melanin hyperpigmentation: Secondary | ICD-10-CM | POA: Diagnosis not present

## 2023-04-19 DIAGNOSIS — Z85828 Personal history of other malignant neoplasm of skin: Secondary | ICD-10-CM | POA: Diagnosis not present

## 2023-04-19 DIAGNOSIS — L821 Other seborrheic keratosis: Secondary | ICD-10-CM | POA: Diagnosis not present

## 2023-04-19 DIAGNOSIS — D1801 Hemangioma of skin and subcutaneous tissue: Secondary | ICD-10-CM | POA: Diagnosis not present

## 2023-04-19 DIAGNOSIS — D692 Other nonthrombocytopenic purpura: Secondary | ICD-10-CM | POA: Diagnosis not present

## 2023-04-19 DIAGNOSIS — D2261 Melanocytic nevi of right upper limb, including shoulder: Secondary | ICD-10-CM | POA: Diagnosis not present

## 2023-07-30 ENCOUNTER — Other Ambulatory Visit (HOSPITAL_BASED_OUTPATIENT_CLINIC_OR_DEPARTMENT_OTHER): Payer: Self-pay | Admitting: Family Medicine

## 2023-07-30 DIAGNOSIS — Z6823 Body mass index (BMI) 23.0-23.9, adult: Secondary | ICD-10-CM | POA: Diagnosis not present

## 2023-07-30 DIAGNOSIS — G35 Multiple sclerosis: Secondary | ICD-10-CM | POA: Diagnosis not present

## 2023-07-30 DIAGNOSIS — E7849 Other hyperlipidemia: Secondary | ICD-10-CM | POA: Diagnosis not present

## 2023-07-30 DIAGNOSIS — Z0001 Encounter for general adult medical examination with abnormal findings: Secondary | ICD-10-CM | POA: Diagnosis not present

## 2023-07-30 DIAGNOSIS — K219 Gastro-esophageal reflux disease without esophagitis: Secondary | ICD-10-CM | POA: Diagnosis not present

## 2023-07-30 DIAGNOSIS — E039 Hypothyroidism, unspecified: Secondary | ICD-10-CM | POA: Diagnosis not present

## 2023-07-30 DIAGNOSIS — E782 Mixed hyperlipidemia: Secondary | ICD-10-CM | POA: Diagnosis not present

## 2023-07-30 DIAGNOSIS — E559 Vitamin D deficiency, unspecified: Secondary | ICD-10-CM | POA: Diagnosis not present

## 2023-08-02 DIAGNOSIS — Z1231 Encounter for screening mammogram for malignant neoplasm of breast: Secondary | ICD-10-CM | POA: Diagnosis not present

## 2023-09-07 ENCOUNTER — Ambulatory Visit (HOSPITAL_BASED_OUTPATIENT_CLINIC_OR_DEPARTMENT_OTHER)
Admission: RE | Admit: 2023-09-07 | Discharge: 2023-09-07 | Disposition: A | Discharge status: Home / Self Care | Payer: Self-pay | Source: Ambulatory Visit | Attending: Family Medicine | Admitting: Family Medicine

## 2023-09-07 DIAGNOSIS — Z0001 Encounter for general adult medical examination with abnormal findings: Secondary | ICD-10-CM | POA: Insufficient documentation

## 2023-09-16 DIAGNOSIS — H43393 Other vitreous opacities, bilateral: Secondary | ICD-10-CM | POA: Diagnosis not present

## 2023-09-21 DIAGNOSIS — E782 Mixed hyperlipidemia: Secondary | ICD-10-CM | POA: Diagnosis not present

## 2023-09-21 DIAGNOSIS — Z6823 Body mass index (BMI) 23.0-23.9, adult: Secondary | ICD-10-CM | POA: Diagnosis not present

## 2023-09-30 DIAGNOSIS — J301 Allergic rhinitis due to pollen: Secondary | ICD-10-CM | POA: Diagnosis not present

## 2023-09-30 DIAGNOSIS — R052 Subacute cough: Secondary | ICD-10-CM | POA: Diagnosis not present

## 2023-09-30 DIAGNOSIS — J3089 Other allergic rhinitis: Secondary | ICD-10-CM | POA: Diagnosis not present

## 2023-09-30 DIAGNOSIS — H1045 Other chronic allergic conjunctivitis: Secondary | ICD-10-CM | POA: Diagnosis not present

## 2024-01-20 DIAGNOSIS — Z1231 Encounter for screening mammogram for malignant neoplasm of breast: Secondary | ICD-10-CM | POA: Diagnosis not present

## 2024-01-20 DIAGNOSIS — Z853 Personal history of malignant neoplasm of breast: Secondary | ICD-10-CM | POA: Diagnosis not present

## 2024-02-02 DIAGNOSIS — L910 Hypertrophic scar: Secondary | ICD-10-CM | POA: Diagnosis not present

## 2024-02-08 ENCOUNTER — Encounter: Payer: Self-pay | Admitting: Adult Health

## 2024-02-08 ENCOUNTER — Ambulatory Visit: Admitting: Adult Health

## 2024-02-08 VITALS — BP 152/94 | HR 75 | Ht 62.0 in | Wt 131.0 lb

## 2024-02-08 DIAGNOSIS — Z853 Personal history of malignant neoplasm of breast: Secondary | ICD-10-CM

## 2024-02-08 DIAGNOSIS — G35 Multiple sclerosis: Secondary | ICD-10-CM | POA: Diagnosis not present

## 2024-02-08 DIAGNOSIS — Z01419 Encounter for gynecological examination (general) (routine) without abnormal findings: Secondary | ICD-10-CM

## 2024-02-08 DIAGNOSIS — Z1211 Encounter for screening for malignant neoplasm of colon: Secondary | ICD-10-CM | POA: Diagnosis not present

## 2024-02-08 DIAGNOSIS — R030 Elevated blood-pressure reading, without diagnosis of hypertension: Secondary | ICD-10-CM | POA: Diagnosis not present

## 2024-02-08 LAB — HEMOCCULT GUIAC POC 1CARD (OFFICE): Fecal Occult Blood, POC: NEGATIVE

## 2024-02-08 NOTE — Progress Notes (Signed)
 Patient ID: [name], female   DOB: 01/12/1956, 68 y.o.   MRN: [id number] History of Present Illness: [name] is a 68 year old white female,married, PM in for a well woman gyn exam.  PCP is Dr Marvine   Current Medications, Allergies, Past Medical History, Past Surgical History, Family History and Social History were reviewed in [employer] electronic medical record.     Review of Systems: Patient denies any headaches, hearing loss, fatigue, blurred vision, shortness of breath, chest pain, abdominal pain, problems with bowel movements, urination, or intercourse(not as active). No joint pain or mood swings.  MS is good Denies any vaginal bleeding    Physical Exam:BP (!) 152/94 (BP Location: Right Arm, Patient Position: Sitting, Cuff Size: Normal)   Pulse 75   Ht 5' 2 (1.575 m)   Wt 131 lb (59.4 kg)   BMI 23.96 kg/m   General:  Well developed, well nourished, no acute distress Skin:  Warm and dry Neck:  Midline trachea, normal thyroid , good ROM, no lymphadenopathy, no carotid bruits heard  Lungs; Clear to auscultation bilaterally Breast:  No dominant palpable mass, retraction, or nipple discharge on the left, has implant, sp mastectomy on the right has implant  Cardiovascular: Regular rate and rhythm Abdomen:  Soft, non tender, no hepatosplenomegaly Pelvic:  External genitalia is normal in appearance, no lesions.  The vagina is pale. Urethra has no lesions or masses. The cervix is smooth.  Uterus is felt to be normal size, shape, and contour.  No adnexal masses or tenderness noted.Bladder is non tender, no masses felt. Rectal: Good sphincter tone, no polyps, or hemorrhoids felt.  Hemoccult negative. Extremities/musculoskeletal:  No swelling or varicosities noted, no clubbing or cyanosis Psych:  No mood changes, alert and cooperative,seems happy AA is 2 Fall risk is low    02/08/2024    1:29 PM 01/05/2022   10:02 AM 01/02/2021    1:47 PM  Depression screen PHQ 2/9   Decreased Interest 1 0 0  Down, Depressed, Hopeless 1 1 0  PHQ - 2 Score 2 1 0  Altered sleeping 1 0 0  Tired, decreased energy 1 1 1   Change in appetite 0 0 0  Feeling bad or failure about yourself  0 0 0  Trouble concentrating 0 0 0  Moving slowly or fidgety/restless 0 0 0  Suicidal thoughts 0 0 0  PHQ-9 Score 4 2 1    She is on Lexapro, and doing well    02/08/2024    1:29 PM 01/05/2022   10:02 AM 01/02/2021    1:47 PM 09/05/2019   10:31 AM  GAD 7 : Generalized Anxiety Score  Nervous, Anxious, on Edge 1 1 0 0  Control/stop worrying 0 1 0 0  Worry too much - different things 0 0 0 0  Trouble relaxing 0 1 0 0  Restless 0 0 0 0  Easily annoyed or irritable 0 0 0 0  Afraid - awful might happen 1 1 0 0  Total GAD 7 Score 2 4 0 0  Anxiety Difficulty    Not difficult at all    Upstream - 02/08/24 1328       Pregnancy Intention Screening   Does the patient want to become pregnant in the next year? N/A    Does the patient's partner want to become pregnant in the next year? N/A    Would the patient like to discuss contraceptive options today? N/A      Contraception Wrap Up  Current Method No Method - Other Reason   PM   End Method No Method - Other Reason   PM   Contraception Counseling Provided No           Examination chaperoned by Clarita Salt LPN   Impression and plan: 1. Encounter for well woman exam with routine gynecological exam (Primary) Physical in 2 years Mammogram was negative, and saw surgeon in [place] recently  Colonoscopy  per GI Labs with PCP Stay active   2. Encounter for screening fecal occult blood testing Hemoccult was negative  - POCT occult blood stool  3. History of breast cancer  4. MS (multiple sclerosis) (HCC) Doing well   5. Elevated BP without diagnosis of hypertension Keep check on BP, follow up with PCP

## 2024-03-06 DIAGNOSIS — Z23 Encounter for immunization: Secondary | ICD-10-CM | POA: Diagnosis not present

## 2024-03-14 DIAGNOSIS — L281 Prurigo nodularis: Secondary | ICD-10-CM | POA: Diagnosis not present

## 2024-03-14 DIAGNOSIS — D485 Neoplasm of uncertain behavior of skin: Secondary | ICD-10-CM | POA: Diagnosis not present

## 2024-03-14 DIAGNOSIS — L910 Hypertrophic scar: Secondary | ICD-10-CM | POA: Diagnosis not present

## 2024-04-12 DIAGNOSIS — E039 Hypothyroidism, unspecified: Secondary | ICD-10-CM | POA: Diagnosis not present

## 2024-04-12 DIAGNOSIS — K219 Gastro-esophageal reflux disease without esophagitis: Secondary | ICD-10-CM | POA: Diagnosis not present

## 2024-04-12 DIAGNOSIS — R030 Elevated blood-pressure reading, without diagnosis of hypertension: Secondary | ICD-10-CM | POA: Diagnosis not present

## 2024-04-12 DIAGNOSIS — E785 Hyperlipidemia, unspecified: Secondary | ICD-10-CM | POA: Diagnosis not present

## 2024-04-12 DIAGNOSIS — R1310 Dysphagia, unspecified: Secondary | ICD-10-CM | POA: Diagnosis not present

## 2024-04-17 DIAGNOSIS — D638 Anemia in other chronic diseases classified elsewhere: Secondary | ICD-10-CM | POA: Diagnosis present

## 2024-04-17 DIAGNOSIS — Z9011 Acquired absence of right breast and nipple: Secondary | ICD-10-CM | POA: Diagnosis not present

## 2024-04-17 DIAGNOSIS — Z853 Personal history of malignant neoplasm of breast: Secondary | ICD-10-CM | POA: Diagnosis not present

## 2024-04-17 DIAGNOSIS — E039 Hypothyroidism, unspecified: Secondary | ICD-10-CM | POA: Diagnosis present

## 2024-04-17 DIAGNOSIS — R140 Abdominal distension (gaseous): Secondary | ICD-10-CM | POA: Diagnosis not present

## 2024-04-17 DIAGNOSIS — E785 Hyperlipidemia, unspecified: Secondary | ICD-10-CM | POA: Diagnosis not present

## 2024-04-17 DIAGNOSIS — K2100 Gastro-esophageal reflux disease with esophagitis, without bleeding: Secondary | ICD-10-CM | POA: Diagnosis present

## 2024-04-17 DIAGNOSIS — K2289 Other specified disease of esophagus: Secondary | ICD-10-CM | POA: Diagnosis present

## 2024-04-17 DIAGNOSIS — K3189 Other diseases of stomach and duodenum: Secondary | ICD-10-CM | POA: Diagnosis present

## 2024-04-17 DIAGNOSIS — Z79899 Other long term (current) drug therapy: Secondary | ICD-10-CM | POA: Diagnosis not present

## 2024-04-17 DIAGNOSIS — C50919 Malignant neoplasm of unspecified site of unspecified female breast: Secondary | ICD-10-CM | POA: Diagnosis not present

## 2024-04-17 DIAGNOSIS — J9811 Atelectasis: Secondary | ICD-10-CM | POA: Diagnosis not present

## 2024-04-17 DIAGNOSIS — I10 Essential (primary) hypertension: Secondary | ICD-10-CM | POA: Diagnosis present

## 2024-04-17 DIAGNOSIS — J690 Pneumonitis due to inhalation of food and vomit: Secondary | ICD-10-CM | POA: Diagnosis present

## 2024-04-17 DIAGNOSIS — R918 Other nonspecific abnormal finding of lung field: Secondary | ICD-10-CM | POA: Diagnosis not present

## 2024-04-17 DIAGNOSIS — D72829 Elevated white blood cell count, unspecified: Secondary | ICD-10-CM | POA: Diagnosis not present

## 2024-04-17 DIAGNOSIS — J189 Pneumonia, unspecified organism: Secondary | ICD-10-CM | POA: Diagnosis not present

## 2024-04-17 DIAGNOSIS — R109 Unspecified abdominal pain: Secondary | ICD-10-CM | POA: Diagnosis not present

## 2024-04-17 DIAGNOSIS — K317 Polyp of stomach and duodenum: Secondary | ICD-10-CM | POA: Diagnosis present

## 2024-04-17 DIAGNOSIS — E7800 Pure hypercholesterolemia, unspecified: Secondary | ICD-10-CM | POA: Diagnosis present

## 2024-04-17 DIAGNOSIS — Z79890 Hormone replacement therapy (postmenopausal): Secondary | ICD-10-CM | POA: Diagnosis not present

## 2024-04-17 DIAGNOSIS — D649 Anemia, unspecified: Secondary | ICD-10-CM | POA: Diagnosis not present

## 2024-04-17 DIAGNOSIS — R079 Chest pain, unspecified: Secondary | ICD-10-CM | POA: Diagnosis not present

## 2024-04-17 DIAGNOSIS — K449 Diaphragmatic hernia without obstruction or gangrene: Secondary | ICD-10-CM | POA: Diagnosis present

## 2024-04-22 DIAGNOSIS — K317 Polyp of stomach and duodenum: Secondary | ICD-10-CM | POA: Diagnosis not present

## 2024-04-25 DIAGNOSIS — J690 Pneumonitis due to inhalation of food and vomit: Secondary | ICD-10-CM | POA: Diagnosis not present

## 2024-04-25 DIAGNOSIS — K219 Gastro-esophageal reflux disease without esophagitis: Secondary | ICD-10-CM | POA: Diagnosis not present

## 2024-05-04 DIAGNOSIS — D2261 Melanocytic nevi of right upper limb, including shoulder: Secondary | ICD-10-CM | POA: Diagnosis not present

## 2024-05-04 DIAGNOSIS — L910 Hypertrophic scar: Secondary | ICD-10-CM | POA: Diagnosis not present

## 2024-05-04 DIAGNOSIS — Z85828 Personal history of other malignant neoplasm of skin: Secondary | ICD-10-CM | POA: Diagnosis not present

## 2024-05-04 DIAGNOSIS — D1801 Hemangioma of skin and subcutaneous tissue: Secondary | ICD-10-CM | POA: Diagnosis not present

## 2024-05-04 DIAGNOSIS — L821 Other seborrheic keratosis: Secondary | ICD-10-CM | POA: Diagnosis not present

## 2024-05-04 DIAGNOSIS — L814 Other melanin hyperpigmentation: Secondary | ICD-10-CM | POA: Diagnosis not present

## 2024-05-04 DIAGNOSIS — D225 Melanocytic nevi of trunk: Secondary | ICD-10-CM | POA: Diagnosis not present
